# Patient Record
Sex: Female | Born: 1975 | Race: White | Hispanic: No | Marital: Married | Smoking: Never smoker
Health system: Southern US, Community
[De-identification: ages and names within clinical notes are randomized; demographics above are authoritative.]

## PROBLEM LIST (undated history)

## (undated) ENCOUNTER — Inpatient Hospital Stay (HOSPITAL_COMMUNITY): Payer: Self-pay

## (undated) DIAGNOSIS — S93609A Unspecified sprain of unspecified foot, initial encounter: Secondary | ICD-10-CM

## (undated) DIAGNOSIS — E559 Vitamin D deficiency, unspecified: Secondary | ICD-10-CM

## (undated) DIAGNOSIS — O09529 Supervision of elderly multigravida, unspecified trimester: Secondary | ICD-10-CM

## (undated) DIAGNOSIS — J45909 Unspecified asthma, uncomplicated: Secondary | ICD-10-CM

## (undated) DIAGNOSIS — M722 Plantar fascial fibromatosis: Secondary | ICD-10-CM

## (undated) DIAGNOSIS — Z309 Encounter for contraceptive management, unspecified: Secondary | ICD-10-CM

## (undated) DIAGNOSIS — E669 Obesity, unspecified: Secondary | ICD-10-CM

## (undated) DIAGNOSIS — M1990 Unspecified osteoarthritis, unspecified site: Secondary | ICD-10-CM

## (undated) DIAGNOSIS — Z3491 Encounter for supervision of normal pregnancy, unspecified, first trimester: Principal | ICD-10-CM

## (undated) DIAGNOSIS — N390 Urinary tract infection, site not specified: Secondary | ICD-10-CM

## (undated) DIAGNOSIS — Z889 Allergy status to unspecified drugs, medicaments and biological substances status: Secondary | ICD-10-CM

## (undated) DIAGNOSIS — F419 Anxiety disorder, unspecified: Secondary | ICD-10-CM

## (undated) DIAGNOSIS — K219 Gastro-esophageal reflux disease without esophagitis: Secondary | ICD-10-CM

## (undated) DIAGNOSIS — G4730 Sleep apnea, unspecified: Secondary | ICD-10-CM

## (undated) HISTORY — DX: Unspecified asthma, uncomplicated: J45.909

## (undated) HISTORY — DX: Encounter for supervision of normal pregnancy, unspecified, first trimester: Z34.91

## (undated) HISTORY — DX: Vitamin D deficiency, unspecified: E55.9

## (undated) HISTORY — DX: Sleep apnea, unspecified: G47.30

## (undated) HISTORY — DX: Encounter for contraceptive management, unspecified: Z30.9

## (undated) HISTORY — DX: Unspecified osteoarthritis, unspecified site: M19.90

## (undated) HISTORY — DX: Plantar fascial fibromatosis: M72.2

## (undated) HISTORY — DX: Urinary tract infection, site not specified: N39.0

## (undated) HISTORY — DX: Supervision of elderly multigravida, unspecified trimester: O09.529

## (undated) HISTORY — PX: WISDOM TOOTH EXTRACTION: SHX21

## (undated) HISTORY — DX: Unspecified sprain of unspecified foot, initial encounter: S93.609A

---

## 1999-09-04 ENCOUNTER — Encounter: Admission: RE | Admit: 1999-09-04 | Discharge: 1999-09-04 | Payer: Self-pay | Admitting: Family Medicine

## 1999-09-04 ENCOUNTER — Encounter: Payer: Self-pay | Admitting: Family Medicine

## 2000-09-14 ENCOUNTER — Other Ambulatory Visit: Admission: RE | Admit: 2000-09-14 | Discharge: 2000-09-14 | Payer: Self-pay | Admitting: Obstetrics and Gynecology

## 2001-09-18 ENCOUNTER — Other Ambulatory Visit: Admission: RE | Admit: 2001-09-18 | Discharge: 2001-09-18 | Payer: Self-pay | Admitting: Obstetrics and Gynecology

## 2002-09-26 ENCOUNTER — Other Ambulatory Visit: Admission: RE | Admit: 2002-09-26 | Discharge: 2002-09-26 | Payer: Self-pay | Admitting: Obstetrics and Gynecology

## 2003-04-20 HISTORY — PX: OTHER SURGICAL HISTORY: SHX169

## 2010-10-06 ENCOUNTER — Encounter: Payer: Self-pay | Admitting: Adult Health

## 2011-11-10 ENCOUNTER — Other Ambulatory Visit (HOSPITAL_COMMUNITY)
Admission: RE | Admit: 2011-11-10 | Discharge: 2011-11-10 | Disposition: A | Discharge status: Home / Self Care | Payer: BC Managed Care – PPO | Source: Ambulatory Visit | Attending: Obstetrics and Gynecology | Admitting: Obstetrics and Gynecology

## 2011-11-10 ENCOUNTER — Other Ambulatory Visit: Payer: Self-pay | Admitting: Adult Health

## 2011-11-10 DIAGNOSIS — Z113 Encounter for screening for infections with a predominantly sexual mode of transmission: Secondary | ICD-10-CM | POA: Insufficient documentation

## 2011-11-10 DIAGNOSIS — R87810 Cervical high risk human papillomavirus (HPV) DNA test positive: Secondary | ICD-10-CM | POA: Insufficient documentation

## 2011-11-10 DIAGNOSIS — Z01419 Encounter for gynecological examination (general) (routine) without abnormal findings: Secondary | ICD-10-CM | POA: Insufficient documentation

## 2011-11-10 LAB — OB RESULTS CONSOLE ANTIBODY SCREEN: Antibody Screen: NEGATIVE

## 2011-11-10 LAB — OB RESULTS CONSOLE HEPATITIS B SURFACE ANTIGEN: Hepatitis B Surface Ag: NEGATIVE

## 2012-01-09 ENCOUNTER — Encounter (HOSPITAL_COMMUNITY): Payer: Self-pay | Admitting: *Deleted

## 2012-01-09 ENCOUNTER — Inpatient Hospital Stay (HOSPITAL_COMMUNITY)
Admission: AD | Admit: 2012-01-09 | Discharge: 2012-01-09 | Disposition: A | Discharge status: Home / Self Care | Payer: BC Managed Care – PPO | Source: Ambulatory Visit | Attending: Obstetrics and Gynecology | Admitting: Obstetrics and Gynecology

## 2012-01-09 DIAGNOSIS — O26859 Spotting complicating pregnancy, unspecified trimester: Secondary | ICD-10-CM | POA: Insufficient documentation

## 2012-01-09 DIAGNOSIS — R109 Unspecified abdominal pain: Secondary | ICD-10-CM | POA: Insufficient documentation

## 2012-01-09 HISTORY — DX: Allergy status to unspecified drugs, medicaments and biological substances: Z88.9

## 2012-01-09 HISTORY — DX: Obesity, unspecified: E66.9

## 2012-01-09 LAB — URINALYSIS, ROUTINE W REFLEX MICROSCOPIC
Bilirubin Urine: NEGATIVE
Glucose, UA: NEGATIVE mg/dL
Hgb urine dipstick: NEGATIVE
Ketones, ur: NEGATIVE mg/dL
Leukocytes, UA: NEGATIVE
Nitrite: NEGATIVE
Protein, ur: NEGATIVE mg/dL
Specific Gravity, Urine: <1.005 — ABNORMAL LOW (ref 1.005–1.030)
Urobilinogen, UA: 0.2 mg/dL (ref 0.0–1.0)
pH: 5.5 (ref 5.0–8.0)

## 2012-01-09 LAB — WET PREP, GENITAL
Clue Cells Wet Prep HPF POC: NONE SEEN
Trich, Wet Prep: NONE SEEN
Yeast Wet Prep HPF POC: NONE SEEN

## 2012-01-09 NOTE — MAU Note (Signed)
"  WHen I went to the [facility] Friday, I saw brown d/c with wiping.  It has continued, but today it was a little more pink.  I've had more pelvic pressure since Friday into Saturday morning.  No LOF."

## 2012-01-09 NOTE — MAU Note (Signed)
Pt reports on Friday had some brown mucusy discharge when wiping. Had some yesterday and brighter red today and reports increased pelvic pressure.

## 2012-01-09 NOTE — MAU Provider Note (Signed)
History     CSN: [id number]  Arrival date and time: 01/09/12 1130   First Provider Initiated Contact with Patient 01/09/12 1232      Chief Complaint  Patient presents with  . Vaginal Bleeding   HPI 36 y.o. G1P0 at [redacted]w[redacted]d with brown/pink discharge since Friday, only with wiping, pelvic pressure. Prenatal care at [facility] - states she has had 3 normal u/s.    Past Medical History  Diagnosis Date  . Obesity   . Multiple allergies     Past Surgical History  Procedure Date  . Carpel tunnel release 2005    bilateral    Family History  Problem Relation Age of Onset  . Stroke Maternal Grandmother   . Heart disease Father   . Hypertension Mother     History  Substance Use Topics  . Smoking status: Never Smoker   . Smokeless tobacco: Never Used  . Alcohol Use: No    Allergies: No Known Allergies  Prescriptions prior to admission  Medication Sig Dispense Refill  . cetirizine (ZYRTEC) 10 MG tablet Take 10 mg by mouth daily.      . hydrocortisone cream 1 % Apply 1 application topically 2 (two) times daily. For rash      . Prenatal Vit-Fe Fumarate-FA (PRENATAL MULTIVITAMIN) TABS Take 1 tablet by mouth daily.      . pseudoephedrine-acetaminophen (TYLENOL SINUS) 30-500 MG TABS Take 1 tablet by mouth every 4 (four) hours as needed. For cold symptoms        Review of Systems  Constitutional: Negative.   Respiratory: Negative.   Cardiovascular: Negative.   Gastrointestinal: Negative for nausea, vomiting, abdominal pain, diarrhea and constipation.  Genitourinary: Negative for dysuria, urgency, frequency, hematuria and flank pain.       Positive for vaginal discharge   Musculoskeletal: Negative.   Neurological: Negative.   Psychiatric/Behavioral: Negative.    Physical Exam   Blood pressure 135/86, pulse 102, temperature 98.2 F (36.8 C), temperature source Oral, resp. rate 18, height 5\' 4"  (1.626 m), weight 233 lb 12.8 oz (106.051 kg).  Physical Exam    Constitutional: She is oriented to person, place, and time. She appears well-developed and well-nourished. No distress.  HENT:  Head: Normocephalic and atraumatic.  Cardiovascular: Normal rate, regular rhythm and normal heart sounds.   Respiratory: Effort normal and breath sounds normal. No respiratory distress.  GI: Soft. Bowel sounds are normal. She exhibits no distension and no mass. There is no tenderness. There is no rebound and no guarding.  Genitourinary: There is no rash or lesion on the right labia. There is no rash or lesion on the left labia. Uterus is not deviated, not enlarged, not fixed and not tender. Cervix exhibits friability. Cervix exhibits no motion tenderness and no discharge. Right adnexum displays no mass, no tenderness and no fullness. Left adnexum displays no mass, no tenderness and no fullness. No erythema, tenderness or bleeding around the vagina. Vaginal discharge (small amount of blood tinged mucous) found.  Neurological: She is alert and oriented to person, place, and time.  Skin: Skin is warm and dry.  Psychiatric: She has a normal mood and affect.   Cervix long and closed + FHR  MAU Course  Procedures Results for orders placed during the hospital encounter of 01/09/12 (from the past 24 hour(s))  URINALYSIS, ROUTINE W REFLEX MICROSCOPIC     Status: Abnormal   Collection Time   01/09/12 11:43 AM      Component Value Range  Color, Urine YELLOW  YELLOW   APPearance CLEAR  CLEAR   Specific Gravity, Urine <1.005 (*) 1.005 - 1.030   pH 5.5  5.0 - 8.0   Glucose, UA NEGATIVE  NEGATIVE mg/dL   Hgb urine dipstick NEGATIVE  NEGATIVE   Bilirubin Urine NEGATIVE  NEGATIVE   Ketones, ur NEGATIVE  NEGATIVE mg/dL   Protein, ur NEGATIVE  NEGATIVE mg/dL   Urobilinogen, UA 0.2  0.0 - 1.0 mg/dL   Nitrite NEGATIVE  NEGATIVE   Leukocytes, UA NEGATIVE  NEGATIVE  WET PREP, GENITAL     Status: Abnormal   Collection Time   01/09/12 12:32 PM      Component Value Range   Yeast  Wet Prep HPF POC NONE SEEN  NONE SEEN   Trich, Wet Prep NONE SEEN  NONE SEEN   Clue Cells Wet Prep HPF POC NONE SEEN  NONE SEEN   WBC, Wet Prep HPF POC MANY (*) NONE SEEN     Assessment and Plan  36 y.o. G1P0 at [redacted]w[redacted]d 1. Spotting in pregnancy   2. Round ligament pain  Precautions rev'd Follow up as scheduled  Lyn Joens 01/09/2012, 12:32 PM

## 2012-01-10 LAB — GC/CHLAMYDIA PROBE AMP, GENITAL
Chlamydia, DNA Probe: NEGATIVE
GC Probe Amp, Genital: NEGATIVE

## 2012-01-18 NOTE — MAU Provider Note (Signed)
Attestation of Attending Supervision of Advanced Practitioner: Evaluation and management procedures were performed by the PA/NP/CNM/OB Fellow under my supervision/collaboration. Chart reviewed and agree with management and plan.  Roanne Haye V 01/18/2012 3:37 PM     

## 2012-03-18 ENCOUNTER — Encounter (HOSPITAL_COMMUNITY): Payer: Self-pay | Admitting: Obstetrics and Gynecology

## 2012-03-18 ENCOUNTER — Inpatient Hospital Stay (HOSPITAL_COMMUNITY)
Admission: AD | Admit: 2012-03-18 | Discharge: 2012-03-18 | Disposition: A | Discharge status: Home / Self Care | Payer: BC Managed Care – PPO | Source: Ambulatory Visit | Attending: Obstetrics and Gynecology | Admitting: Obstetrics and Gynecology

## 2012-03-18 DIAGNOSIS — O26859 Spotting complicating pregnancy, unspecified trimester: Secondary | ICD-10-CM | POA: Insufficient documentation

## 2012-03-18 DIAGNOSIS — N72 Inflammatory disease of cervix uteri: Secondary | ICD-10-CM | POA: Insufficient documentation

## 2012-03-18 DIAGNOSIS — O2390 Unspecified genitourinary tract infection in pregnancy, unspecified trimester: Secondary | ICD-10-CM | POA: Insufficient documentation

## 2012-03-18 LAB — URINALYSIS, ROUTINE W REFLEX MICROSCOPIC
Nitrite: NEGATIVE
Specific Gravity, Urine: 1.01 (ref 1.005–1.030)
Urobilinogen, UA: 0.2 mg/dL (ref 0.0–1.0)

## 2012-03-18 LAB — URINE MICROSCOPIC-ADD ON

## 2012-03-18 LAB — WET PREP, GENITAL: Yeast Wet Prep HPF POC: NONE SEEN

## 2012-03-18 NOTE — MAU Note (Signed)
Pt reports having some brown spotting since Thursday Can bee a little red at times. Denies any pain or cramping

## 2012-03-18 NOTE — MAU Note (Signed)
"  I noticed some spotting on Thursday and it has been off and on.  It is brownish in color and then there will be none.  No pain at all.  (+) FM."

## 2012-03-18 NOTE — MAU Provider Note (Signed)
History     CSN: [id number]  Arrival date and time: 03/18/12 2952   First Provider Initiated Contact with Patient 03/18/12 1050      Chief Complaint  Patient presents with  . Vaginal Bleeding   HPI  Jamie Wang is a 36 y.o. G1P0 who presents today with brown-ish spotting since Thursday. The last time she had intercourse was about a week ago. She has had this same brown spotting off and on all throughout her pregnancy. Baby is moving normally. Denies any gush or leaking of fluid. Denies any pain.   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  Past Medical History  Diagnosis Date  . Obesity   . Multiple allergies     Past Surgical History  Procedure Date  . Carpel tunnel release 2005    bilateral    Family History  Problem Relation Age of Onset  . Stroke Maternal Grandmother   . Heart disease Father   . Hypertension Mother     History  Substance Use Topics  . Smoking status: Never Smoker   . Smokeless tobacco: Never Used  . Alcohol Use: No    Allergies: No Known Allergies  Prescriptions prior to admission  Medication Sig Dispense Refill  . cetirizine (ZYRTEC) 10 MG tablet Take 10 mg by mouth daily.      . hydrocortisone cream 1 % Apply 1 application topically 2 (two) times daily. For rash      . Prenatal Vit-Fe Fumarate-FA (PRENATAL MULTIVITAMIN) TABS Take 1 tablet by mouth daily.        Review of Systems  Constitutional: Negative for fever.  Gastrointestinal: Negative for nausea, vomiting, abdominal pain, diarrhea and constipation.  Genitourinary: Negative for dysuria, urgency and frequency.   Physical Exam   Blood pressure 131/92, pulse 101, temperature 98.5 F (36.9 C), temperature source Oral, resp. rate 18, height 5\' 4"  (1.626 m), weight 238 lb (107.956 kg).  Physical Exam  Nursing note and vitals reviewed. Constitutional: She is oriented to person, place, and time. She appears well-developed and  well-nourished.  Cardiovascular: Normal rate.   Respiratory: Effort normal.  GI: Soft.  Genitourinary:        External: normal Vagina: small amount of blood tinged mucous present Cervix: 1 at ext os, but closed at internal os/thick/high Small amount of blood seen after cervical exam Uterus: AGA FHT 120, moderate 15x15 accels.No decels Toco: no UCs.   Neurological: She is alert and oriented to person, place, and time.  Skin: Skin is warm and dry.    MAU Course  Procedures  Results for orders placed during the hospital encounter of 03/18/12 (from the past 24 hour(s))  URINALYSIS, ROUTINE W REFLEX MICROSCOPIC     Status: Abnormal   Collection Time   03/18/12 10:00 AM      Component Value Range   Color, Urine YELLOW  YELLOW   APPearance CLEAR  CLEAR   Specific Gravity, Urine 1.010  1.005 - 1.030   pH 7.0  5.0 - 8.0   Glucose, UA NEGATIVE  NEGATIVE mg/dL   Hgb urine dipstick TRACE (*) NEGATIVE   Bilirubin Urine NEGATIVE  NEGATIVE   Ketones, ur NEGATIVE  NEGATIVE mg/dL   Protein, ur NEGATIVE  NEGATIVE mg/dL   Urobilinogen, UA 0.2  0.0 - 1.0 mg/dL   Nitrite NEGATIVE  NEGATIVE   Leukocytes, UA SMALL (*) NEGATIVE  URINE MICROSCOPIC-ADD ON     Status: Abnormal  Collection Time   03/18/12 10:00 AM      Component Value Range   Squamous Epithelial / LPF FEW (*) RARE   WBC, UA 0-2  <3 WBC/hpf   RBC / HPF 0-2  <3 RBC/hpf  WET PREP, GENITAL     Status: Abnormal   Collection Time   03/18/12 11:04 AM      Component Value Range   Yeast Wet Prep HPF POC NONE SEEN  NONE SEEN   Trich, Wet Prep NONE SEEN  NONE SEEN   Clue Cells Wet Prep HPF POC NONE SEEN  NONE SEEN   WBC, Wet Prep HPF POC MANY (*) NONE SEEN    Assessment and Plan  Cervicitis in pregnancy FU with PCP as needed.   Tawnya Crook 03/18/2012, 10:51 AM

## 2012-03-20 NOTE — MAU Provider Note (Signed)
Attestation of Attending Supervision of Advanced Practitioner (CNM/NP): Evaluation and management procedures were performed by the Advanced Practitioner under my supervision and collaboration.  I have reviewed the Advanced Practitioner's note and chart, and I agree with the management and plan.  Jamiee Milholland 03/20/2012 12:42 PM   

## 2012-04-13 ENCOUNTER — Encounter (HOSPITAL_COMMUNITY)
Admission: AD | Disposition: A | Discharge status: Home / Self Care | Payer: Self-pay | Source: Ambulatory Visit | Attending: Obstetrics and Gynecology

## 2012-04-13 ENCOUNTER — Encounter (HOSPITAL_COMMUNITY): Payer: Self-pay | Admitting: Anesthesiology

## 2012-04-13 ENCOUNTER — Encounter (HOSPITAL_COMMUNITY): Payer: BC Managed Care – PPO

## 2012-04-13 ENCOUNTER — Inpatient Hospital Stay (HOSPITAL_COMMUNITY): Payer: BC Managed Care – PPO

## 2012-04-13 ENCOUNTER — Encounter (HOSPITAL_COMMUNITY): Payer: Self-pay | Admitting: *Deleted

## 2012-04-13 ENCOUNTER — Inpatient Hospital Stay (HOSPITAL_COMMUNITY)
Admission: AD | Admit: 2012-04-13 | Discharge: 2012-04-16 | DRG: 371 | Disposition: A | Discharge status: Home / Self Care | Payer: BC Managed Care – PPO | Source: Ambulatory Visit | Attending: Obstetrics and Gynecology | Admitting: Obstetrics and Gynecology

## 2012-04-13 ENCOUNTER — Inpatient Hospital Stay (HOSPITAL_COMMUNITY): Payer: BC Managed Care – PPO | Admitting: Anesthesiology

## 2012-04-13 DIAGNOSIS — O365990 Maternal care for other known or suspected poor fetal growth, unspecified trimester, not applicable or unspecified: Secondary | ICD-10-CM | POA: Diagnosis present

## 2012-04-13 DIAGNOSIS — O321XX0 Maternal care for breech presentation, not applicable or unspecified: Secondary | ICD-10-CM | POA: Diagnosis present

## 2012-04-13 DIAGNOSIS — O09529 Supervision of elderly multigravida, unspecified trimester: Secondary | ICD-10-CM

## 2012-04-13 DIAGNOSIS — IMO0002 Reserved for concepts with insufficient information to code with codable children: Secondary | ICD-10-CM | POA: Diagnosis present

## 2012-04-13 DIAGNOSIS — O368190 Decreased fetal movements, unspecified trimester, not applicable or unspecified: Secondary | ICD-10-CM | POA: Diagnosis present

## 2012-04-13 DIAGNOSIS — Z98891 History of uterine scar from previous surgery: Secondary | ICD-10-CM

## 2012-04-13 DIAGNOSIS — O288 Other abnormal findings on antenatal screening of mother: Secondary | ICD-10-CM

## 2012-04-13 DIAGNOSIS — O09519 Supervision of elderly primigravida, unspecified trimester: Secondary | ICD-10-CM | POA: Diagnosis present

## 2012-04-13 LAB — CBC
HCT: 38.5 % (ref 36.0–46.0)
Hemoglobin: 13.4 g/dL (ref 12.0–15.0)
Platelets: 204 10*3/uL (ref 150–400)
RBC: 4.27 MIL/uL (ref 3.87–5.11)
WBC: 8.3 10*3/uL (ref 4.0–10.5)

## 2012-04-13 LAB — TYPE AND SCREEN: ABO/RH(D): A POS

## 2012-04-13 LAB — RPR: RPR Ser Ql: NONREACTIVE

## 2012-04-13 LAB — RAPID URINE DRUG SCREEN, HOSP PERFORMED: Amphetamines: NOT DETECTED

## 2012-04-13 LAB — ABO/RH: ABO/RH(D): A POS

## 2012-04-13 SURGERY — Surgical Case
Anesthesia: Spinal | Site: Abdomen | Wound class: Clean Contaminated

## 2012-04-13 MED ORDER — METOCLOPRAMIDE HCL 5 MG/ML IJ SOLN: 10.0000 mg | Freq: Three times a day (TID) | INTRAMUSCULAR | Status: DC | PRN

## 2012-04-13 MED ORDER — LIDOCAINE-EPINEPHRINE 2 %-1:100000 IJ SOLN: INTRAMUSCULAR | Status: DC | PRN

## 2012-04-13 MED ORDER — DIPHENHYDRAMINE HCL 50 MG/ML IJ SOLN
12.5000 mg | INTRAMUSCULAR | Status: DC | PRN
Start: 2012-04-13 — End: 2012-04-16

## 2012-04-13 MED ORDER — WITCH HAZEL-GLYCERIN EX PADS: 1.0000 "application " | MEDICATED_PAD | CUTANEOUS | Status: DC | PRN

## 2012-04-13 MED ORDER — SIMETHICONE 80 MG PO CHEW
80.0000 mg | CHEWABLE_TABLET | Freq: Three times a day (TID) | ORAL | Status: DC
  Administered 2012-04-13 – 2012-04-16 (×10): 80 mg via ORAL

## 2012-04-13 MED ORDER — CEFAZOLIN SODIUM-DEXTROSE 2-3 GM-% IV SOLR
INTRAVENOUS | Status: DC | PRN
  Administered 2012-04-13: 2 g via INTRAVENOUS

## 2012-04-13 MED ORDER — HYDROMORPHONE HCL PF 1 MG/ML IJ SOLN: 0.2500 mg | INTRAMUSCULAR | Status: DC | PRN

## 2012-04-13 MED ORDER — IBUPROFEN 600 MG PO TABS
600.0000 mg | ORAL_TABLET | Freq: Four times a day (QID) | ORAL | Status: DC
  Administered 2012-04-13 – 2012-04-16 (×11): 600 mg via ORAL
  Filled 2012-04-13 (×11): qty 1

## 2012-04-13 MED ORDER — MEPERIDINE HCL 25 MG/ML IJ SOLN: 6.2500 mg | INTRAMUSCULAR | Status: DC | PRN

## 2012-04-13 MED ORDER — DIPHENHYDRAMINE HCL 25 MG PO CAPS: 25.0000 mg | ORAL_CAPSULE | Freq: Four times a day (QID) | ORAL | Status: DC | PRN

## 2012-04-13 MED ORDER — PRENATAL MULTIVITAMIN CH: 1.0000 | ORAL_TABLET | Freq: Every day | ORAL | Status: DC

## 2012-04-13 MED ORDER — PHENYLEPHRINE 40 MCG/ML (10ML) SYRINGE FOR IV PUSH (FOR BLOOD PRESSURE SUPPORT)
PREFILLED_SYRINGE | INTRAVENOUS | Status: AC
  Filled 2012-04-13: qty 5

## 2012-04-13 MED ORDER — MAGNESIUM SULFATE 40 G IN LACTATED RINGERS - SIMPLE
2.0000 g/h | INTRAVENOUS | Status: DC
  Filled 2012-04-13: qty 500

## 2012-04-13 MED ORDER — NALBUPHINE HCL 10 MG/ML IJ SOLN
5.0000 mg | INTRAMUSCULAR | Status: DC | PRN
  Filled 2012-04-13: qty 1

## 2012-04-13 MED ORDER — MAGNESIUM SULFATE BOLUS VIA INFUSION
4.0000 g | Freq: Once | INTRAVENOUS | Status: AC
  Administered 2012-04-13: 4 g via INTRAVENOUS
  Filled 2012-04-13: qty 500

## 2012-04-13 MED ORDER — DOCUSATE SODIUM 100 MG PO CAPS: 100.0000 mg | ORAL_CAPSULE | Freq: Every day | ORAL | Status: DC

## 2012-04-13 MED ORDER — PRENATAL MULTIVITAMIN CH
1.0000 | ORAL_TABLET | Freq: Every day | ORAL | Status: DC
  Administered 2012-04-14 – 2012-04-15 (×2): 1 via ORAL
  Filled 2012-04-13 (×2): qty 1

## 2012-04-13 MED ORDER — SODIUM BICARBONATE 8.4 % IV SOLN
INTRAVENOUS | Status: DC | PRN
  Administered 2012-04-13: 5 mL via EPIDURAL

## 2012-04-13 MED ORDER — PHENYLEPHRINE 40 MCG/ML (10ML) SYRINGE FOR IV PUSH (FOR BLOOD PRESSURE SUPPORT)
PREFILLED_SYRINGE | INTRAVENOUS | Status: AC
  Filled 2012-04-13: qty 15

## 2012-04-13 MED ORDER — FENTANYL CITRATE 0.05 MG/ML IJ SOLN
INTRAMUSCULAR | Status: DC | PRN
  Administered 2012-04-13: 12.5 ug via INTRATHECAL

## 2012-04-13 MED ORDER — OXYCODONE-ACETAMINOPHEN 5-325 MG PO TABS
1.0000 | ORAL_TABLET | ORAL | Status: DC | PRN
  Administered 2012-04-14: 1 via ORAL
  Filled 2012-04-13 (×2): qty 1

## 2012-04-13 MED ORDER — NALOXONE HCL 1 MG/ML IJ SOLN
1.0000 ug/kg/h | INTRAVENOUS | Status: DC | PRN
  Filled 2012-04-13: qty 2

## 2012-04-13 MED ORDER — MORPHINE SULFATE (PF) 0.5 MG/ML IJ SOLN
INTRAMUSCULAR | Status: DC | PRN
  Administered 2012-04-13: .2 mg via INTRATHECAL

## 2012-04-13 MED ORDER — BETAMETHASONE SOD PHOS & ACET 6 (3-3) MG/ML IJ SUSP
12.0000 mg | Freq: Once | INTRAMUSCULAR | Status: AC
  Administered 2012-04-13: 12 mg via INTRAMUSCULAR
  Filled 2012-04-13: qty 2

## 2012-04-13 MED ORDER — OXYTOCIN 10 UNIT/ML IJ SOLN
INTRAMUSCULAR | Status: AC
  Filled 2012-04-13: qty 4

## 2012-04-13 MED ORDER — ZOLPIDEM TARTRATE 5 MG PO TABS: 5.0000 mg | ORAL_TABLET | Freq: Every evening | ORAL | Status: DC | PRN

## 2012-04-13 MED ORDER — 0.9 % SODIUM CHLORIDE (POUR BTL) OPTIME
TOPICAL | Status: DC | PRN
  Administered 2012-04-13: 1000 mL

## 2012-04-13 MED ORDER — DIBUCAINE 1 % RE OINT: 1.0000 "application " | TOPICAL_OINTMENT | RECTAL | Status: DC | PRN

## 2012-04-13 MED ORDER — SODIUM CHLORIDE 0.9 % IJ SOLN: 3.0000 mL | INTRAMUSCULAR | Status: DC | PRN

## 2012-04-13 MED ORDER — SCOPOLAMINE 1 MG/3DAYS TD PT72
MEDICATED_PATCH | TRANSDERMAL | Status: AC
  Administered 2012-04-13: 1.5 mg via TRANSDERMAL
  Filled 2012-04-13: qty 1

## 2012-04-13 MED ORDER — BUPIVACAINE IN DEXTROSE 0.75-8.25 % IT SOLN
INTRATHECAL | Status: DC | PRN
  Administered 2012-04-13: 1.5 mL via INTRATHECAL

## 2012-04-13 MED ORDER — ACETAMINOPHEN 325 MG PO TABS: 650.0000 mg | ORAL_TABLET | ORAL | Status: DC | PRN

## 2012-04-13 MED ORDER — CALCIUM CARBONATE ANTACID 500 MG PO CHEW: 2.0000 | CHEWABLE_TABLET | ORAL | Status: DC | PRN

## 2012-04-13 MED ORDER — ONDANSETRON HCL 4 MG PO TABS: 4.0000 mg | ORAL_TABLET | ORAL | Status: DC | PRN

## 2012-04-13 MED ORDER — MORPHINE SULFATE 0.5 MG/ML IJ SOLN
INTRAMUSCULAR | Status: AC
  Filled 2012-04-13: qty 10

## 2012-04-13 MED ORDER — SIMETHICONE 80 MG PO CHEW: 80.0000 mg | CHEWABLE_TABLET | ORAL | Status: DC | PRN

## 2012-04-13 MED ORDER — LANOLIN HYDROUS EX OINT: 1.0000 "application " | TOPICAL_OINTMENT | CUTANEOUS | Status: DC | PRN

## 2012-04-13 MED ORDER — EPHEDRINE 5 MG/ML INJ
INTRAVENOUS | Status: AC
  Filled 2012-04-13: qty 10

## 2012-04-13 MED ORDER — PHENYLEPHRINE HCL 10 MG/ML IJ SOLN
INTRAMUSCULAR | Status: DC | PRN
  Administered 2012-04-13 (×3): 80 ug via INTRAVENOUS
  Administered 2012-04-13: 40 ug via INTRAVENOUS
  Administered 2012-04-13 (×3): 80 ug via INTRAVENOUS
  Administered 2012-04-13: 40 ug via INTRAVENOUS
  Administered 2012-04-13: 80 ug via INTRAVENOUS
  Administered 2012-04-13: 40 ug via INTRAVENOUS
  Administered 2012-04-13: 80 ug via INTRAVENOUS
  Administered 2012-04-13: 40 ug via INTRAVENOUS

## 2012-04-13 MED ORDER — CEFAZOLIN SODIUM-DEXTROSE 2-3 GM-% IV SOLR
INTRAVENOUS | Status: AC
  Filled 2012-04-13: qty 50

## 2012-04-13 MED ORDER — LACTATED RINGERS IV SOLN
INTRAVENOUS | Status: DC
  Administered 2012-04-13 (×2): via INTRAVENOUS

## 2012-04-13 MED ORDER — LACTATED RINGERS IV SOLN
INTRAVENOUS | Status: DC | PRN
  Administered 2012-04-13 (×2): via INTRAVENOUS

## 2012-04-13 MED ORDER — KETOROLAC TROMETHAMINE 30 MG/ML IJ SOLN: 30.0000 mg | Freq: Four times a day (QID) | INTRAMUSCULAR | Status: AC | PRN

## 2012-04-13 MED ORDER — ONDANSETRON HCL 4 MG/2ML IJ SOLN
INTRAMUSCULAR | Status: AC
  Filled 2012-04-13: qty 2

## 2012-04-13 MED ORDER — KETOROLAC TROMETHAMINE 30 MG/ML IJ SOLN: 15.0000 mg | Freq: Once | INTRAMUSCULAR | Status: DC | PRN

## 2012-04-13 MED ORDER — KETOROLAC TROMETHAMINE 60 MG/2ML IM SOLN
INTRAMUSCULAR | Status: AC
  Administered 2012-04-13: 60 mg via INTRAMUSCULAR
  Filled 2012-04-13: qty 2

## 2012-04-13 MED ORDER — PROMETHAZINE HCL 25 MG/ML IJ SOLN: 6.2500 mg | INTRAMUSCULAR | Status: DC | PRN

## 2012-04-13 MED ORDER — SENNOSIDES-DOCUSATE SODIUM 8.6-50 MG PO TABS
2.0000 | ORAL_TABLET | Freq: Every day | ORAL | Status: DC
  Administered 2012-04-13 – 2012-04-15 (×3): 2 via ORAL

## 2012-04-13 MED ORDER — SCOPOLAMINE 1 MG/3DAYS TD PT72
1.0000 | MEDICATED_PATCH | Freq: Once | TRANSDERMAL | Status: AC
  Administered 2012-04-13: 1.5 mg via TRANSDERMAL

## 2012-04-13 MED ORDER — TETANUS-DIPHTH-ACELL PERTUSSIS 5-2.5-18.5 LF-MCG/0.5 IM SUSP
0.5000 mL | Freq: Once | INTRAMUSCULAR | Status: AC
  Administered 2012-04-14: 0.5 mL via INTRAMUSCULAR
  Filled 2012-04-13: qty 0.5

## 2012-04-13 MED ORDER — OXYTOCIN 10 UNIT/ML IJ SOLN
40.0000 [IU] | INTRAVENOUS | Status: DC | PRN
  Administered 2012-04-13: 40 [IU] via INTRAVENOUS

## 2012-04-13 MED ORDER — MENTHOL 3 MG MT LOZG: 1.0000 | LOZENGE | OROMUCOSAL | Status: DC | PRN

## 2012-04-13 MED ORDER — EPHEDRINE SULFATE 50 MG/ML IJ SOLN
INTRAMUSCULAR | Status: DC | PRN
  Administered 2012-04-13 (×2): 10 mg via INTRAVENOUS

## 2012-04-13 MED ORDER — DIPHENHYDRAMINE HCL 25 MG PO CAPS: 25.0000 mg | ORAL_CAPSULE | ORAL | Status: DC | PRN

## 2012-04-13 MED ORDER — CITRIC ACID-SODIUM CITRATE 334-500 MG/5ML PO SOLN
ORAL | Status: AC
  Filled 2012-04-13: qty 15

## 2012-04-13 MED ORDER — NALOXONE HCL 0.4 MG/ML IJ SOLN: 0.4000 mg | INTRAMUSCULAR | Status: DC | PRN

## 2012-04-13 MED ORDER — DIPHENHYDRAMINE HCL 50 MG/ML IJ SOLN: 25.0000 mg | INTRAMUSCULAR | Status: DC | PRN

## 2012-04-13 MED ORDER — LACTATED RINGERS IV SOLN: INTRAVENOUS | Status: DC

## 2012-04-13 MED ORDER — ONDANSETRON HCL 4 MG/2ML IJ SOLN: 4.0000 mg | Freq: Three times a day (TID) | INTRAMUSCULAR | Status: DC | PRN

## 2012-04-13 MED ORDER — ONDANSETRON HCL 4 MG/2ML IJ SOLN
INTRAMUSCULAR | Status: DC | PRN
  Administered 2012-04-13: 4 mg via INTRAVENOUS

## 2012-04-13 MED ORDER — KETOROLAC TROMETHAMINE 60 MG/2ML IM SOLN
60.0000 mg | Freq: Once | INTRAMUSCULAR | Status: AC | PRN
  Administered 2012-04-13: 60 mg via INTRAMUSCULAR

## 2012-04-13 MED ORDER — OXYTOCIN 40 UNITS IN LACTATED RINGERS INFUSION - SIMPLE MED: 62.5000 mL/h | INTRAVENOUS | Status: AC

## 2012-04-13 MED ORDER — FENTANYL CITRATE 0.05 MG/ML IJ SOLN
INTRAMUSCULAR | Status: AC
  Filled 2012-04-13: qty 2

## 2012-04-13 MED ORDER — BETAMETHASONE SOD PHOS & ACET 6 (3-3) MG/ML IJ SUSP
12.0000 mg | Freq: Once | INTRAMUSCULAR | Status: DC
  Filled 2012-04-13: qty 2

## 2012-04-13 MED ORDER — ONDANSETRON HCL 4 MG/2ML IJ SOLN: 4.0000 mg | INTRAMUSCULAR | Status: DC | PRN

## 2012-04-13 SURGICAL SUPPLY — 37 items
BENZOIN TINCTURE PRP APPL 2/3 (GAUZE/BANDAGES/DRESSINGS) IMPLANT
CLOTH BEACON ORANGE TIMEOUT ST (SAFETY) ×2 IMPLANT
DRAIN JACKSON PRT FLT 10 (DRAIN) ×2 IMPLANT
DRAPE LG THREE QUARTER DISP (DRAPES) ×2 IMPLANT
DRSG OPSITE POSTOP 4X10 (GAUZE/BANDAGES/DRESSINGS) IMPLANT
DRSG OPSITE POSTOP 4X12 (GAUZE/BANDAGES/DRESSINGS) ×2 IMPLANT
DURAPREP 26ML APPLICATOR (WOUND CARE) ×2 IMPLANT
ELECT REM PT RETURN 9FT ADLT (ELECTROSURGICAL) ×2
ELECTRODE REM PT RTRN 9FT ADLT (ELECTROSURGICAL) ×1 IMPLANT
EVACUATOR SILICONE 100CC (DRAIN) ×2 IMPLANT
EXTRACTOR VACUUM KIWI (MISCELLANEOUS) IMPLANT
GLOVE BIO SURGEON ST LM GN SZ9 (GLOVE) ×2 IMPLANT
GLOVE BIOGEL PI IND STRL 9 (GLOVE) ×2 IMPLANT
GLOVE BIOGEL PI INDICATOR 9 (GLOVE) ×2
GOWN PREVENTION PLUS LG XLONG (DISPOSABLE) ×2 IMPLANT
GOWN PREVENTION PLUS XLARGE (GOWN DISPOSABLE) ×2 IMPLANT
GOWN STRL REIN 3XL LVL4 (GOWN DISPOSABLE) ×2 IMPLANT
NEEDLE HYPO 25X5/8 SAFETYGLIDE (NEEDLE) IMPLANT
NS IRRIG 1000ML POUR BTL (IV SOLUTION) ×2 IMPLANT
PACK C SECTION WH (CUSTOM PROCEDURE TRAY) ×2 IMPLANT
PAD OB MATERNITY 4.3X12.25 (PERSONAL CARE ITEMS) IMPLANT
RETRACTOR WND ALEXIS 25 LRG (MISCELLANEOUS) ×1 IMPLANT
RTRCTR C-SECT PINK 25CM LRG (MISCELLANEOUS) IMPLANT
RTRCTR WOUND ALEXIS 25CM LRG (MISCELLANEOUS) ×2
SLEEVE SCD COMPRESS KNEE MED (MISCELLANEOUS) IMPLANT
STRIP CLOSURE SKIN 1/2X4 (GAUZE/BANDAGES/DRESSINGS) IMPLANT
SUT CHROMIC 0 CTX 36 (SUTURE) ×4 IMPLANT
SUT ETHILON 3 0 PS 1 (SUTURE) ×2 IMPLANT
SUT VIC AB 0 CT1 27 (SUTURE) ×1
SUT VIC AB 0 CT1 27XBRD ANBCTR (SUTURE) ×1 IMPLANT
SUT VIC AB 2-0 CT1 27 (SUTURE) ×3
SUT VIC AB 2-0 CT1 TAPERPNT 27 (SUTURE) ×3 IMPLANT
SUT VIC AB 4-0 KS 27 (SUTURE) ×2 IMPLANT
SYR BULB IRRIGATION 50ML (SYRINGE) IMPLANT
TOWEL OR 17X24 6PK STRL BLUE (TOWEL DISPOSABLE) ×2 IMPLANT
TRAY FOLEY CATH 14FR (SET/KITS/TRAYS/PACK) IMPLANT
WATER STERILE IRR 1000ML POUR (IV SOLUTION) ×2 IMPLANT

## 2012-04-13 NOTE — Brief Op Note (Addendum)
04/13/2012  10:30 AM  PATIENT:  Jamie Wang  36 y.o. female  PRE-OPERATIVE DIAGNOSIS:  nonreassuring fetal heart rate pregnancy 30+6 weeks, Breech presentation  POST-OPERATIVE DIAGNOSIS:  nonreassurring fetal heart rate, Fetal Growth restriction, 30+weeks, Breech presentation  PROCEDURE:  Procedure(s) (LRB) with comments: CESAREAN SECTION (N/A)Primary Low transverse incision  SURGEON:  Surgeon(s) and Role:    * Tilda Burrow, MD - Primary  PHYSICIAN ASSISTANT:   ASSISTANTS: none   ANESTHESIA:   epidural and spinal  EBL:  Total I/O In: 2250 [I.V.:2250] Out: 1125 [Urine:725; Blood:400]  BLOOD ADMINISTERED:none  DRAINS: (1) Jackson-Pratt drain(s) with closed bulb suction in the subfascial space foley to bladder drain. Foley catheter  LOCAL MEDICATIONS USED:  NONE  SPECIMEN:  Source of Specimen:  placenta to path  DISPOSITION OF SPECIMEN:  PATHOLOGY  COUNTS:  YES  TOURNIQUET:  * No tourniquets in log *  DICTATION: .Dragon Dictation Patient was taken to the operating room prepped and draped for lower surgery after spinal anesthesia was introduced. Abdominal prep was performed fetal heart rate was confirmed as 140s prior to the prepping and draping. The spinal did not give adequate analgesia so the patient was once again set up on, epidural catheter placed, and abdomen prepped and draped again. After waiting until adequate analgesia was achieved, and 940 a transverse incision was initiated and the method of Pfannenstiel. Generous bleeding vessels were encountered throughout the fatty tissue at the level of the fascia. A transverse opening of the fascia was sharply performed and the and loosened off of the underlying rectus muscles. Several large bleeders were encountered in this space. Some were cauterized and some were HEENT hemostats initially. Peritoneum was opened in midline, Alexis wound retractor positioned, and the uterus inspected. The uterus was soft boggy and the  anterior lower uterine segment was able to be opened transversely and the fetal buttocks delivered of a small 1040 g female infant Apgars    ,       . Cord pH was obtained and returned 6.98. Infant was taken to the nursery, accompanied by the baby's father. The placenta was delivered easily, was very small with a marginal insertion of the cord. Transverse incision was then closed in a running locking first layer in continuous running second layer. Good hemostasis was achieved. The the anterior peritoneum was closed with running 2-0 chromic, the fascia closed with running 0 Vicryl. There were several collateral blood vessels on the rectus muscles which required ligation. One on the right side required a figure-of-eight suture to achieve hemostasis a JP drain was placed in the sub fascial space and allowed to exit through the suprapubic area subcutaneous fatty tissues were irrigated and inspected as hemostatic, reapproximated with interrupted 3-0 Vicryl,, then 4-0 Vicryl used to close the skin with a subcuticular closure. EBL 600 cc sponge and needle counts correct  PLAN OF CARE: continue admission  PATIENT DISPOSITION:  PACU - hemodynamically stable.   Delay start of Pharmacological VTE agent (>24hrs) due to surgical blood loss or risk of bleeding: not applicable

## 2012-04-13 NOTE — Anesthesia Preprocedure Evaluation (Signed)
Anesthesia Evaluation  Patient identified by MRN, date of birth, ID band Patient awake    Reviewed: Allergy & Precautions, H&P , NPO status , Patient's Chart, lab work & pertinent test results  Airway Mallampati: II TM Distance: >3 FB Neck ROM: full    Dental No notable dental hx.    Pulmonary neg pulmonary ROS,    Pulmonary exam normal       Cardiovascular negative cardio ROS      Neuro/Psych negative neurological ROS  negative psych ROS   GI/Hepatic negative GI ROS, Neg liver ROS,   Endo/Other  Morbid obesity  Renal/GU negative Renal ROS     Musculoskeletal negative musculoskeletal ROS (+)   Abdominal (+) + obese,   Peds negative pediatric ROS (+)  Hematology negative hematology ROS (+)   Anesthesia Other Findings   Reproductive/Obstetrics (+) Pregnancy                           Anesthesia Physical Anesthesia Plan  ASA: III and emergent  Anesthesia Plan: Spinal   Post-op Pain Management:    Induction:   Airway Management Planned:   Additional Equipment:   Intra-op Plan:   Post-operative Plan:   Informed Consent: I have reviewed the patients History and Physical, chart, labs and discussed the procedure including the risks, benefits and alternatives for the proposed anesthesia with the patient or authorized representative who has indicated his/her understanding and acceptance.     Plan Discussed with: CRNA and Surgeon  Anesthesia Plan Comments:         Anesthesia Quick Evaluation

## 2012-04-13 NOTE — H&P (Signed)
   History     CSN: [id number]  Arrival date and time: 04/13/12 0127   First Provider Initiated Contact with Patient 04/13/12 0146      Chief Complaint  Patient presents with  . Decreased Fetal Movement   HPI  Pt is a G1P0 here at 30.6 wks IUP with report of less movement from baby.  Last time felt move was yesterday, felt flutter.  +FHR with doppler used at home.  No report of bleeding or leaking of fluid.  Denies contractions.  No problems during pregnancy as reported by patient.  Currently goes to [facility] for prenatal care.    Past Medical History  Diagnosis Date  . Obesity   . Multiple allergies     Past Surgical History  Procedure Date  . Carpel tunnel release 2005    bilateral    Family History  Problem Relation Age of Onset  . Stroke Maternal Grandmother   . Heart disease Father   . Hypertension Mother     History  Substance Use Topics  . Smoking status: Never Smoker   . Smokeless tobacco: Never Used  . Alcohol Use: No    Allergies: No Known Allergies  Prescriptions prior to admission  Medication Sig Dispense Refill  . cetirizine (ZYRTEC) 10 MG tablet Take 10 mg by mouth daily.      . hydrocortisone cream 1 % Apply 1 application topically 2 (two) times daily. For rash      . Prenatal Vit-Fe Fumarate-FA (PRENATAL MULTIVITAMIN) TABS Take 1 tablet by mouth daily.        Review of Systems  Constitutional:       Decreased fetal movement  All other systems reviewed and are negative.   Physical Exam   Blood pressure 148/95, pulse 80, temperature 98 F (36.7 C), temperature source Oral, resp. rate 20, height 5\' 4"  (1.626 m), weight 110.678 kg (244 lb).  Physical Exam  Constitutional: She is oriented to person, place, and time. She appears well-developed and well-nourished. No distress.  HENT:  Head: Normocephalic.  Neck: Normal range of motion. Neck supple.  Cardiovascular: Normal rate, regular rhythm and normal heart sounds.   Respiratory:  Effort normal and breath sounds normal.  GI: Soft. There is no tenderness.  Neurological: She is alert and oriented to person, place, and time.  Skin: Skin is warm and dry.    MAU Course  Procedures  NST - 130's, decel 100's x 1 min with return to baseline BPP 2/8  Assessment and Plan  Preterm Pregnancy Non-Reassuring Fetal Well-Being  Plan: Discussed with Dr. Emelda Fear > admit for observation BMZ 12 mg IM Consult with MFM Close observation Ultrasound and dopplers with MFM in am  [facility] 04/13/2012, 1:47 AM

## 2012-04-13 NOTE — Progress Notes (Signed)
MFM note  36 yo G1P0 at 3 6/7 weeks - admitted early this AM with complaints of decreased fetal movement.  Fetal strip remarkable for absent variability and some spontaneous decelerations.  BPP on admission was 2/10.  She completed a single dose of betamethasone and Magnesium sulfate was given for neuro prophylaxis.  Based on non-reassuring fetal tracing and BPP of 2/10, would recommend expeditious delivery.  Dr. Emelda Fear notified of my recommendations at approximately 0815 hrs.  Alpha Gula, MD Maternal Fetal Medicine

## 2012-04-13 NOTE — MAU Note (Signed)
DR Emelda Fear IN ROOM

## 2012-04-13 NOTE — Transfer of Care (Signed)
Immediate Anesthesia Transfer of Care Note  Patient: Jamie Wang  Procedure(s) Performed: Procedure(s) (LRB) with comments: CESAREAN SECTION (N/A)  Patient Location: PACU  Anesthesia Type:Epidural  Level of Consciousness: awake, alert  and oriented  Airway & Oxygen Therapy: Patient Spontanous Breathing  Post-op Assessment: Report given to PACU RN and Post -op Vital signs reviewed and stable  Post vital signs: Reviewed and stable  Complications: No apparent anesthesia complications

## 2012-04-13 NOTE — Op Note (Signed)
see operative details included in brief operative note

## 2012-04-13 NOTE — Progress Notes (Signed)
Dr. Emelda Fear called notified of FHR tracing with late decelerations and uc pattern with uc's every 3-8 min, orders to begin Magnesium Sulfate and monitor closely

## 2012-04-13 NOTE — MAU Note (Signed)
PT SAYS SHE HAS FELT LESS MOVEMENT FROM BABY.  LAST TIME FELT MOVE WAS  Tuesday- FELT FLUTTER-- PT HAS A DOPPLER AT HOME - SO SHE LISTENS TO FHR. .  SHE DRANK MT DEW AND ATE CHOCCOLATE LAST NIGHT- NO MOVEMENT.

## 2012-04-13 NOTE — Anesthesia Postprocedure Evaluation (Signed)
  Anesthesia Post-op Note  Patient: Jamie Wang  Procedure(s) Performed: Procedure(s) (LRB) with comments: CESAREAN SECTION (N/A)  Patient Location: Mother/Baby  Anesthesia Type:Epidural  Level of Consciousness: awake, alert  and oriented  Airway and Oxygen Therapy: Patient Spontanous Breathing  Post-op Pain: none  Post-op Assessment: Post-op Vital signs reviewed, Patient's Cardiovascular Status Stable, No headache, No backache, No residual numbness and No residual motor weakness  Post-op Vital Signs: Reviewed and stable  Complications: No apparent anesthesia complications

## 2012-04-13 NOTE — Anesthesia Postprocedure Evaluation (Signed)
Anesthesia Post Note  Patient: Jamie Wang  Procedure(s) Performed: Procedure(s) (LRB): CESAREAN SECTION (N/A)  Anesthesia type: epidural   Patient location: PACU  Post pain: Pain level controlled  Post assessment: Post-op Vital signs reviewed  Last Vitals:  Filed Vitals:   04/13/12 1145  BP: 129/65  Pulse: 125  Temp:   Resp: 20    Post vital signs: Reviewed  Level of consciousness: awake  Complications: No apparent anesthesia complications

## 2012-04-13 NOTE — H&P (Signed)
36 yr G1 P0 at [redacted]w[redacted]d admitted with decreased fetal movement, and found to have absent beat=to-beat, and occasional decel.  BPP 2/8. With normal fluid volume, no  Placental abnormalities, And BREECH presentation.  Good dating with early u/s and no medical conditions to date.  Had one episode of light spotting at 28 weeks., none recently. Now admitted to L&D where monitor shows irregular mild contractions , given Betamethasone 12 mg IM, and placed on Magnesium Sulfate 4g/2gm per hour, and given fluid bolus and O2.  Currently no decels, but beat to beat remains minimal. Will keep on continuous monitoring, and obtain Doppler studies of fetal cord this morning, Cesarean will be done for deterioration of condition.  Type and screen ordered. Likelihood of delivery soon , by cesarean, emphasized to the patient. Patient currently NPO.

## 2012-04-13 NOTE — MAU Provider Note (Signed)
   History     CSN: [id number]  Arrival date and time: 04/13/12 0127   First Provider Initiated Contact with Patient 04/13/12 0146      Chief Complaint  Patient presents with  . Decreased Fetal Movement   HPI  Pt is a G1P0 here at 30.6 wks IUP with report of less movement from baby.  Last time felt move was yesterday, felt flutter.  +FHR with doppler used at home.  No report of bleeding or leaking of fluid.  Denies contractions.  No problems during pregnancy as reported by patient.  Currently goes to [facility] for prenatal care.    Past Medical History  Diagnosis Date  . Obesity   . Multiple allergies     Past Surgical History  Procedure Date  . Carpel tunnel release 2005    bilateral    Family History  Problem Relation Age of Onset  . Stroke Maternal Grandmother   . Heart disease Father   . Hypertension Mother     History  Substance Use Topics  . Smoking status: Never Smoker   . Smokeless tobacco: Never Used  . Alcohol Use: No    Allergies: No Known Allergies  Prescriptions prior to admission  Medication Sig Dispense Refill  . cetirizine (ZYRTEC) 10 MG tablet Take 10 mg by mouth daily.      . hydrocortisone cream 1 % Apply 1 application topically 2 (two) times daily. For rash      . Prenatal Vit-Fe Fumarate-FA (PRENATAL MULTIVITAMIN) TABS Take 1 tablet by mouth daily.        Review of Systems  Constitutional:       Decreased fetal movement  All other systems reviewed and are negative.   Physical Exam   Blood pressure 148/95, pulse 80, temperature 98 F (36.7 C), temperature source Oral, resp. rate 20, height 5' 4" (1.626 m), weight 110.678 kg (244 lb).  Physical Exam  Constitutional: She is oriented to person, place, and time. She appears well-developed and well-nourished. No distress.  HENT:  Head: Normocephalic.  Neck: Normal range of motion. Neck supple.  Cardiovascular: Normal rate, regular rhythm and normal heart sounds.   Respiratory:  Effort normal and breath sounds normal.  GI: Soft. There is no tenderness.  Neurological: She is alert and oriented to person, place, and time.  Skin: Skin is warm and dry.    MAU Course  Procedures  NST - 130's, decel 100's x 1 min with return to baseline BPP 2/8  Assessment and Plan  Preterm Pregnancy Non-Reassuring Fetal Well-Being  Plan: Discussed with Dr. Ferguson > admit for observation BMZ 12 mg IM Consult with MFM Close observation Ultrasound and dopplers with MFM in am  MUHAMMAD,WALIDAH 04/13/2012, 1:47 AM   

## 2012-04-13 NOTE — Progress Notes (Signed)
Jamie Wang was tearful and still in shock over the events of the past day and the birth of her baby at 62 weeks.  She has good support and a strong faith that will help her to cope with the situation.  She requested prayer and I offered emotional support and compassionate listening as well.  We will continue to follow up with her, but please also page as needs arise.  [address] [place] Pager, [phone] 12:09 PM   04/13/12 1200  Clinical Encounter Type  Visited With Patient  Visit Type Spiritual support  Referral From Patient's clergy  Spiritual Encounters  Spiritual Needs Emotional;Prayer

## 2012-04-13 NOTE — Addendum Note (Signed)
Addendum  created 04/13/12 1406 by Sandrea Hughs., MD   Modules edited:Anesthesia LDA

## 2012-04-13 NOTE — Anesthesia Procedure Notes (Addendum)
Spinal  Patient location during procedure: OR Start time: 04/13/2012 8:56 AM End time: 04/13/2012 9:00 AM Staffing Anesthesiologist: Sandrea Hughs Performed by: anesthesiologist  Preanesthetic Checklist Completed: patient identified, site marked, surgical consent, pre-op evaluation, timeout performed, IV checked, risks and benefits discussed and monitors and equipment checked Spinal Block Patient position: sitting Prep: DuraPrep Patient monitoring: heart rate, cardiac monitor, continuous pulse ox and blood pressure Approach: midline Location: L3-4 Injection technique: single-shot Needle Needle type: Sprotte  Needle gauge: 24 G Needle length: 9 cm Needle insertion depth: 7 cm Assessment Sensory level: T12  Epidural Patient location during procedure: OB  Preanesthetic Checklist Completed: patient identified, site marked, surgical consent, pre-op evaluation, timeout performed, IV checked, risks and benefits discussed and monitors and equipment checked  Epidural Patient position: sitting Prep: site prepped and draped and DuraPrep Patient monitoring: continuous pulse ox and blood pressure Approach: midline Injection technique: LOR air  Needle:  Needle type: Tuohy  Needle gauge: 17 G Needle length: 9 cm and 9 Needle insertion depth: 6 cm Catheter type: closed end flexible Catheter size: 19 Gauge Catheter at skin depth: 12 cm Test dose: negative  Assessment Events: blood not aspirated, injection not painful, no injection resistance, negative IV test and no paresthesia  Additional Notes (-) asp heme

## 2012-04-13 NOTE — Addendum Note (Signed)
Addendum  created 04/13/12 1837 by Shanon Payor, CRNA   Modules edited:Notes Section

## 2012-04-13 NOTE — Addendum Note (Signed)
Addendum  created 04/13/12 1406 by John Riely Baskett Jr., MD   Modules edited:Anesthesia LDA    

## 2012-04-14 ENCOUNTER — Encounter (HOSPITAL_COMMUNITY): Payer: Self-pay | Admitting: Obstetrics and Gynecology

## 2012-04-14 LAB — CBC
Hemoglobin: 10.1 g/dL — ABNORMAL LOW (ref 12.0–15.0)
MCH: 31.7 pg (ref 26.0–34.0)
MCV: 92.2 fL (ref 78.0–100.0)
RBC: 3.19 MIL/uL — ABNORMAL LOW (ref 3.87–5.11)

## 2012-04-14 NOTE — Progress Notes (Signed)
This was a follow-up visit.  Aleayah reported that her daughter is doing well and so she is hopeful that all will go well, but she is still preparing herself for a different outcome.  She was appropriately tearful and is adjusting to this unexpected turn of events with her baby.  She was particularly tearful as we spoke about her upcoming discharge.  I was able to offer support and reflective listening to [place] as well as Jimmy, FOB, and Ghalia's parents.  We will continue to check in with them when we see them in the NICU, but please also page as needs arise.  [address] [place] Pager, [phone] 3:38 PM   04/14/12 1500  Clinical Encounter Type  Visited With Patient and family together  Visit Type Spiritual support;Follow-up  Spiritual Encounters  Spiritual Needs Emotional  Stress Factors  Patient Stress Factors (Premature baby in NICU)

## 2012-04-14 NOTE — Progress Notes (Signed)
Subjective: Postpartum Day 1: Cesarean Delivery Patient reports incisional pain, tolerating PO and + flatus.   Only using ibuprofen. States does not hurt much  Objective: Vital signs in last 24 hours: Temp:  [98.1 F (36.7 C)-98.9 F (37.2 C)] 98.2 F (36.8 C) (12/27 1610) Pulse Rate:  [80-125] 80  (12/27 0632) Resp:  [15-20] 18  (12/27 0632) BP: (109-151)/(54-82) 117/70 mmHg (12/27 0632) SpO2:  [91 %-100 %] 97 % (12/27 9604) Weight:  [244 lb (110.678 kg)] 244 lb (110.678 kg) (12/26 1340)  Physical Exam:  General: alert and no distress Lochia: appropriate Uterine Fundus: firm Incision: healing well, no significant drainage                                    JP drain with small amount serosanguinous fluid DVT Evaluation: No evidence of DVT seen on physical exam.   Basename 04/14/12 0535 04/13/12 0312  HGB 10.1* 13.4  HCT 29.4* 38.5    Assessment/Plan: Status post Cesarean section. Doing well postoperatively.  Continue current care Will offer discharge tomorrow.  [facility] 04/14/2012, 7:51 AM

## 2012-04-14 NOTE — Clinical Social Work Maternal (Signed)
    Clinical Social Work Department PSYCHOSOCIAL ASSESSMENT - MATERNAL/CHILD 04/14/2012  Patient:  Jamie Wang, Jamie Wang  Account Number:  [id number]  Admit Date:  04/13/2012  Marjo Bicker Name:   Jamie Wang    Clinical Social Worker:  Nobie Putnam, LCSW   Date/Time:  04/14/2012 03:54 PM  Date Referred:  04/14/2012   Referral source  NICU     Referred reason  NICU   Other referral source:    I:  FAMILY / HOME ENVIRONMENT Child's legal guardian:  PARENT  Guardian - Name Guardian - Age Guardian - Address  Jamie Wang [address] [place]; [place], [place] [address]  Jamie Wang 36 (same as above)   Other household support members/support persons Other support:    II  PSYCHOSOCIAL DATA Information Source:    Event organiser Employment:   Financial resources:  Self Pay If Medicaid - County:    School / Grade:   Maternity Care Coordinator / Child Services Coordination / Early Interventions:  Cultural issues impacting care:    III  STRENGTHS Strengths  Adequate Resources  Home prepared for Child (including basic supplies)  Supportive family/friends   Strength comment:    IV  RISK FACTORS AND CURRENT PROBLEMS Current Problem:  None   Risk Factor & Current Problem Patient Issue Family Issue Risk Factor / Current Problem Comment   N N     V  SOCIAL WORK ASSESSMENT CSW met with pt briefly to offer emotional support and resources, as needed.  Chaplain's documentation noted.  Pt appears to be feeling better today however expressed some sadness about leaving the infant when discharged. While pt is sad about the premature delivery, she is thankful that the infant will receive the care & help she needs to grow & develop properly.  She has good family support.  Pt & spouse have not purchased all the necessary supplies but have the resources to obtain them.  Pt is pleased with the information she is receiving from the NICU medical team.  She has not  selected a pediatrician at this time.  CSW provided her with a list of pediatricians in the area.  CSW informed pt that the she infant would qualify for SSI benefits, due to low birth weight and offered to complete an application.  Pt would like to discuss issue with her spouse and contact CSW at later time with decision.  The parents have reliable transportation and aware of visitation hours.  Pt appears to be appropriate and in good spirits during assessment.  CSW will continue to follow family and assist as needed until discharged.      VI SOCIAL WORK PLAN Social Work Plan  Psychosocial Support/Ongoing Assessment of Needs   Type of pt/family education:   If child protective services report - county:   If child protective services report - date:   Information/referral to community resources comment:   Other social work plan:

## 2012-04-15 ENCOUNTER — Encounter (HOSPITAL_COMMUNITY): Payer: BC Managed Care – PPO

## 2012-04-15 NOTE — Progress Notes (Addendum)
Post Partum Day #2 Subjective: no complaints, up ad lib and tolerating PO; pumping breasts; desires OCPs for contraception; reports Dr Emelda Fear mentioned possibly leaving JP in place at d/c  Objective: Blood pressure 117/77, pulse 74, temperature 98 F (36.7 C), temperature source Oral, resp. rate 18, height 5\' 4"  (1.626 m), weight 110.678 kg (244 lb), SpO2 97.00%, unknown if currently breastfeeding.  Physical Exam:  General: alert, cooperative and no distress Lochia: appropriate Uterine Fundus: firm Incision: healing well, no dehiscence, no significant erythema; dsg intact; JP drain with scant serosang fluid DVT Evaluation: No evidence of DVT seen on physical exam.   Basename 04/14/12 0535 04/13/12 0312  HGB 10.1* 13.4  HCT 29.4* 38.5    Assessment/Plan: Plan for discharge tomorrow   LOS: 2 days   Jamie Wang 04/15/2012, 7:14 AM

## 2012-04-16 DIAGNOSIS — Z98891 History of uterine scar from previous surgery: Secondary | ICD-10-CM

## 2012-04-16 MED ORDER — OXYCODONE-ACETAMINOPHEN 5-325 MG PO TABS: 1.0000 | ORAL_TABLET | ORAL | Status: DC | PRN

## 2012-04-16 MED ORDER — IBUPROFEN 600 MG PO TABS: 600.0000 mg | ORAL_TABLET | Freq: Four times a day (QID) | ORAL | Status: DC

## 2012-04-16 NOTE — Discharge Summary (Signed)
Obstetric Discharge Summary Reason for Admission: onset of labor Prenatal Procedures: ultrasound Intrapartum Procedures: cesarean: low cervical, transverse with placement of JP drain Postpartum Procedures: none Complications-Operative and Postpartum: none Hemoglobin  Date Value Range Status  04/14/2012 10.1* 12.0 - 15.0 g/dL Final     DELTA CHECK NOTED     REPEATED TO VERIFY     HCT  Date Value Range Status  04/14/2012 29.4* 36.0 - 46.0 % Final    Physical Exam:  General: alert, cooperative and no distress Lochia: appropriate, scant Uterine Fundus: firm, -1 Incision: no significant drainage DVT Evaluation: No evidence of DVT seen on physical exam. Negative Homan's sign. No cords or calf tenderness. No significant calf/ankle edema.  Discharge Diagnoses: Preterm labor delivered  Discharge Information: Date: 04/16/2012 Activity: pelvic rest Diet: routine Medications: Ibuprofen and Percocet Condition: stable Instructions: refer to practice specific booklet Discharge to: home Dr Emelda Fear to remove JP drain in office  Follow-up Information    Follow up with [facility]. (Keep scheduled appointment on Thursday.  )    Contact information:   [address] [place] [address] [phone]         Newborn Data: Live born female  Birth Weight: 2 lb 6.5 oz (1090 g) APGAR: 4, 8  Infant in NICU  LEFTWICH-KIRBY, Yulianna Folse 04/16/2012, 8:51 AM

## 2012-04-19 NOTE — MAU Provider Note (Signed)
Attestation of Attending Supervision of Advanced Practitioner: Evaluation and management procedures were performed by the PA/NP/CNM/OB Fellow under my supervision/collaboration. Chart reviewed and agree with management and plan.  [name] V 04/19/2012 10:23 AM    

## 2012-05-16 ENCOUNTER — Encounter (HOSPITAL_COMMUNITY)
Admission: RE | Admit: 2012-05-16 | Discharge: 2012-05-16 | Disposition: A | Discharge status: Home / Self Care | Payer: BC Managed Care – PPO | Source: Ambulatory Visit | Attending: Obstetrics and Gynecology | Admitting: Obstetrics and Gynecology

## 2012-06-16 ENCOUNTER — Encounter (HOSPITAL_COMMUNITY)
Admission: RE | Admit: 2012-06-16 | Discharge: 2012-06-16 | Disposition: A | Discharge status: Home / Self Care | Payer: BC Managed Care – PPO | Source: Ambulatory Visit | Attending: Obstetrics and Gynecology | Admitting: Obstetrics and Gynecology

## 2012-06-16 DIAGNOSIS — O923 Agalactia: Secondary | ICD-10-CM | POA: Insufficient documentation

## 2012-07-15 ENCOUNTER — Encounter (HOSPITAL_COMMUNITY)
Admission: RE | Admit: 2012-07-15 | Discharge: 2012-07-15 | Disposition: A | Discharge status: Home / Self Care | Payer: BC Managed Care – PPO | Source: Ambulatory Visit | Attending: Obstetrics and Gynecology | Admitting: Obstetrics and Gynecology

## 2012-07-15 DIAGNOSIS — O923 Agalactia: Secondary | ICD-10-CM | POA: Insufficient documentation

## 2012-08-15 ENCOUNTER — Ambulatory Visit (HOSPITAL_COMMUNITY)
Admission: RE | Admit: 2012-08-15 | Discharge: 2012-08-15 | Disposition: A | Discharge status: Home / Self Care | Payer: BC Managed Care – PPO | Source: Ambulatory Visit | Attending: Obstetrics and Gynecology | Admitting: Obstetrics and Gynecology

## 2012-08-15 DIAGNOSIS — O923 Agalactia: Secondary | ICD-10-CM | POA: Insufficient documentation

## 2012-09-20 ENCOUNTER — Telehealth: Payer: Self-pay | Admitting: Adult Health

## 2012-09-20 MED ORDER — NYSTATIN-TRIAMCINOLONE 100000-0.1 UNIT/GM-% EX CREA: TOPICAL_CREAM | CUTANEOUS | Status: DC

## 2012-09-20 NOTE — Telephone Encounter (Signed)
Jamie Wang called early, has itching patch on abdomen wants some cream, will rx mytex cream 2-3 x daily prn

## 2013-02-06 ENCOUNTER — Ambulatory Visit: Payer: Self-pay | Admitting: Podiatry

## 2013-02-22 ENCOUNTER — Other Ambulatory Visit: Payer: Self-pay

## 2013-02-27 ENCOUNTER — Encounter: Payer: Self-pay | Admitting: Podiatry

## 2013-02-27 ENCOUNTER — Ambulatory Visit (INDEPENDENT_AMBULATORY_CARE_PROVIDER_SITE_OTHER): Payer: BC Managed Care – PPO | Admitting: Podiatry

## 2013-02-27 VITALS — BP 118/82 | HR 89 | Resp 16 | Ht 64.0 in | Wt 250.0 lb

## 2013-02-27 DIAGNOSIS — M722 Plantar fascial fibromatosis: Secondary | ICD-10-CM

## 2013-02-27 NOTE — Progress Notes (Signed)
Jamie Wang presents today for a followup of her plantar fasciitis bilateral she states she is about 90% better. She states that she's been wearing Hirsh tissues around the house as we have suggested.  Objective: Vital signs are stable she is alert and oriented x3. Pulses are palpable bilateral. No calf pain. She has pain on palpation to medial continued tubercle left greater than right.  Assessment: Plantar fasciitis resolving bilateral.  Plan: Continue all conservative therapies and followup with me in one month or as needed.

## 2013-05-29 ENCOUNTER — Encounter: Payer: Self-pay | Admitting: Adult Health

## 2013-05-29 ENCOUNTER — Ambulatory Visit (INDEPENDENT_AMBULATORY_CARE_PROVIDER_SITE_OTHER): Payer: BC Managed Care – PPO | Admitting: Adult Health

## 2013-05-29 ENCOUNTER — Other Ambulatory Visit (HOSPITAL_COMMUNITY)
Admission: RE | Admit: 2013-05-29 | Discharge: 2013-05-29 | Disposition: A | Discharge status: Home / Self Care | Payer: BC Managed Care – PPO | Source: Ambulatory Visit | Attending: Adult Health | Admitting: Adult Health

## 2013-05-29 VITALS — BP 122/78 | HR 74 | Ht 64.0 in | Wt 245.0 lb

## 2013-05-29 DIAGNOSIS — Z1151 Encounter for screening for human papillomavirus (HPV): Secondary | ICD-10-CM | POA: Insufficient documentation

## 2013-05-29 DIAGNOSIS — Z01419 Encounter for gynecological examination (general) (routine) without abnormal findings: Secondary | ICD-10-CM | POA: Insufficient documentation

## 2013-05-29 DIAGNOSIS — Z309 Encounter for contraceptive management, unspecified: Secondary | ICD-10-CM

## 2013-05-29 HISTORY — DX: Encounter for contraceptive management, unspecified: Z30.9

## 2013-05-29 NOTE — Progress Notes (Signed)
Patient ID: Jamie Wang, female   DOB: 04/09/1976, 38 y.o.   MRN: [id number] History of Present Illness: Jamie Wang is a 38 year old white female, married, in for a pap and physical.She is on lo loestrin and has almost no bleeding.Last pap in 10/2011 when pregnant, was negative for malignancy but was + for HPV.   Current Medications, Allergies, Past Medical History, Past Surgical History, Family History and Social History were reviewed in [facility] electronic medical record.     Review of Systems: Patient denies any headaches, blurred vision, shortness of breath, chest pain, abdominal pain, problems with bowel movements, urination, or intercourse.Has pain in feet but [place] foot doctors in [place]also has some discomfort in right hip at times.No moodiness but can be teary at times. She had labs at PCP and cholesterol was mildly elevated.    Physical Exam:BP 122/78  Pulse 74  Ht 5\' 4"  (1.626 m)  Wt 245 lb (111.131 kg)  BMI 42.03 kg/m2  Breastfeeding? No General:  Well developed, well nourished, no acute distress Skin:  Warm and dry Neck:  Midline trachea, normal thyroid Lungs; Clear to auscultation bilaterally Breast:  No dominant palpable mass, retraction, or nipple discharge Cardiovascular: Regular rate and rhythm Abdomen:  Soft, non tender, no hepatosplenomegaly, well healed C section scar Pelvic:  External genitalia is normal in appearance.  The vagina is normal in appearance. The cervix is nulliparous, pap with HPV performed.Marland Kitchen.  Uterus is felt to be normal size, shape, and contour.  No                adnexal masses or tenderness noted. 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  Impression: Yearly gyn exam Contraceptive management    Plan: Physical in 1  year Mammogram at 5040 Continue lo loestrin Number of samples 6  Lot number [id number]    Exp date 2/16

## 2013-05-29 NOTE — Patient Instructions (Signed)
Physical in  1 year Mammogram at 40 

## 2013-06-07 ENCOUNTER — Encounter: Payer: Self-pay | Admitting: Podiatry

## 2013-06-07 ENCOUNTER — Ambulatory Visit (INDEPENDENT_AMBULATORY_CARE_PROVIDER_SITE_OTHER): Payer: BC Managed Care – PPO | Admitting: Podiatry

## 2013-06-07 VITALS — BP 108/70 | HR 100 | Resp 12

## 2013-06-07 DIAGNOSIS — M722 Plantar fascial fibromatosis: Secondary | ICD-10-CM

## 2013-06-07 MED ORDER — MELOXICAM 15 MG PO TABS: 15.0000 mg | ORAL_TABLET | Freq: Every day | ORAL | Status: DC

## 2013-06-07 NOTE — Progress Notes (Signed)
She presents today for followup of her plantar fasciitis states that it has reoccurred.  Objective: Vital signs are stable she is alert and oriented x3. She has pain on palpation medial continued tubercles bilateral right greater than left.  Assessment: Plantar fasciitis bilateral.  Plan: Discussed the etiology pathology conservative versus surgical therapies. Injected the bilateral heels today with 20 mg of Kenalog each heel. Dispensed a night splint. She was scanned for orthotics. And a prescription for Mobic was dispensed.

## 2013-06-19 ENCOUNTER — Ambulatory Visit: Payer: BC Managed Care – PPO | Admitting: Podiatry

## 2013-06-20 ENCOUNTER — Other Ambulatory Visit: Payer: Self-pay | Admitting: Otolaryngology

## 2013-06-20 DIAGNOSIS — J329 Chronic sinusitis, unspecified: Secondary | ICD-10-CM

## 2013-07-05 ENCOUNTER — Inpatient Hospital Stay
Admission: RE | Admit: 2013-07-05 | Discharge: 2013-07-05 | Disposition: A | Discharge status: Home / Self Care | Payer: BC Managed Care – PPO | Source: Ambulatory Visit | Attending: Otolaryngology | Admitting: Otolaryngology

## 2013-07-27 ENCOUNTER — Encounter: Payer: Self-pay | Admitting: *Deleted

## 2013-07-27 NOTE — Progress Notes (Signed)
Patient's husband Jamie Wang was here for appointment to get his orthotics and asked if he could take his wife's pair to her. 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 Dispensed pt's orthotics to husband with instructions.

## 2013-09-17 IMAGING — US US FETAL BPP W/O NONSTRESS
1 series · 8 of 8 positions shown · non-contrast
Comparison: none

CLINICAL DATA: Decreased fetal movement.

BIOPHYSICAL PROFILE
Number of Fetuses: 1
Heart Rate: 138 bpm
Presentation: Frank breech
Movement: No
Amniotic fluid (subjective): Normal
Vertical pocket:  6.9 cm
BPP:
Movement:  0 / 2        Time:  30 minutes
Breathing: 0 / 2
Tone:   0 / 2
Amniotic Fluid:  2 / 2
Total Score:  2 / 8

[Series 1: us fetal bpp w/o nonstress · non-contrast · 8 of 8 slices shown]
[im 1/8]
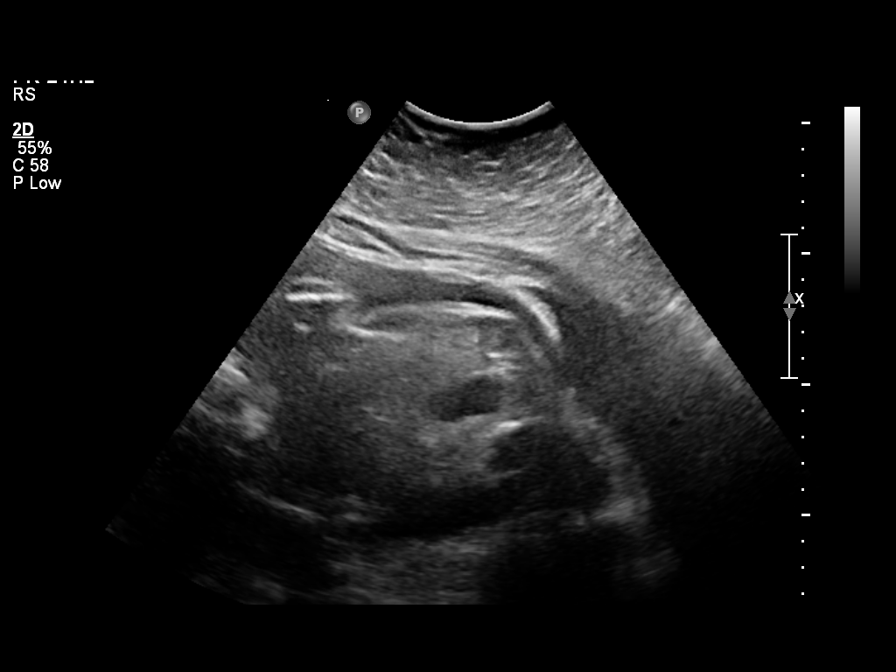
[im 2/8]
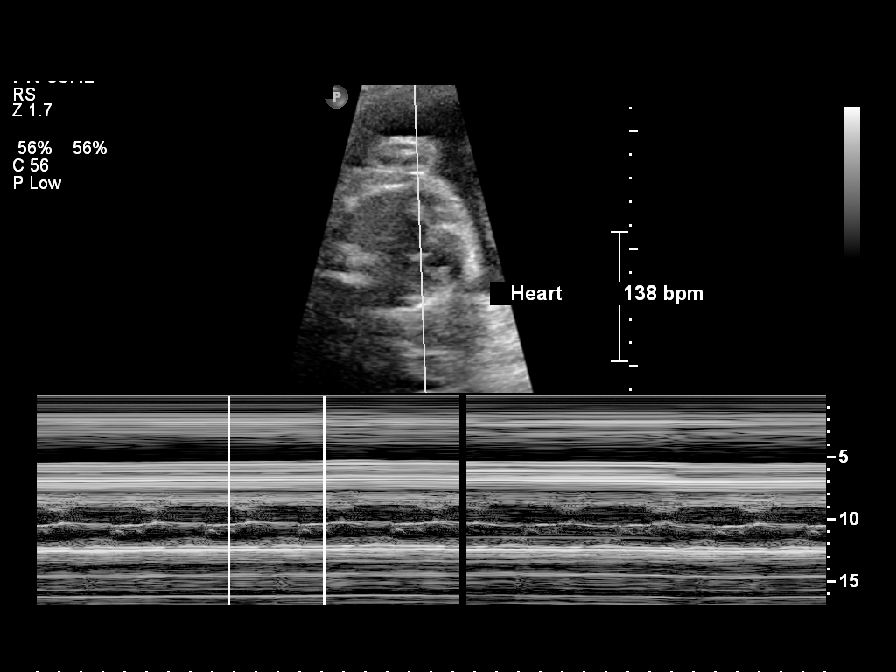
[im 3/8]
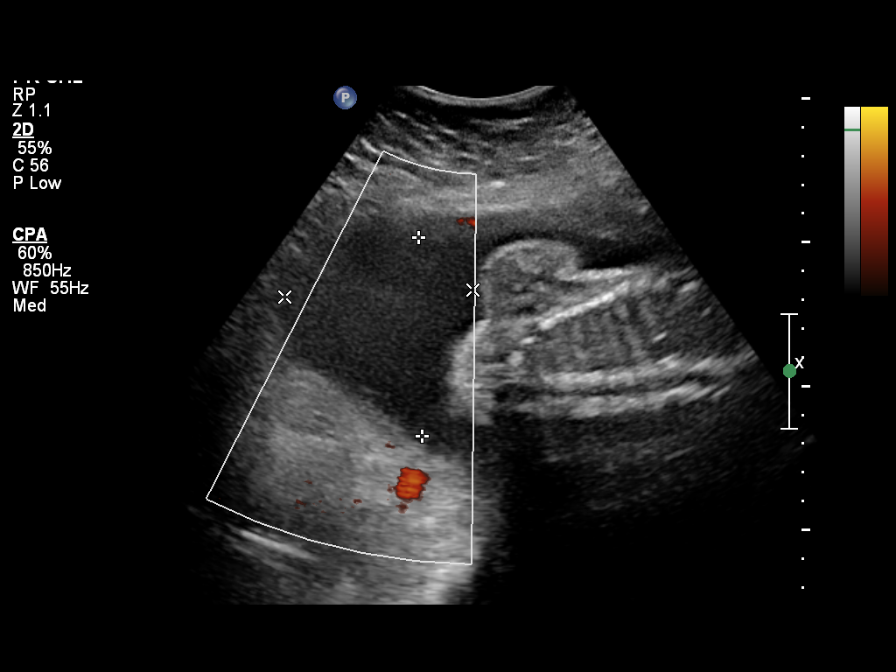
[im 4/8]
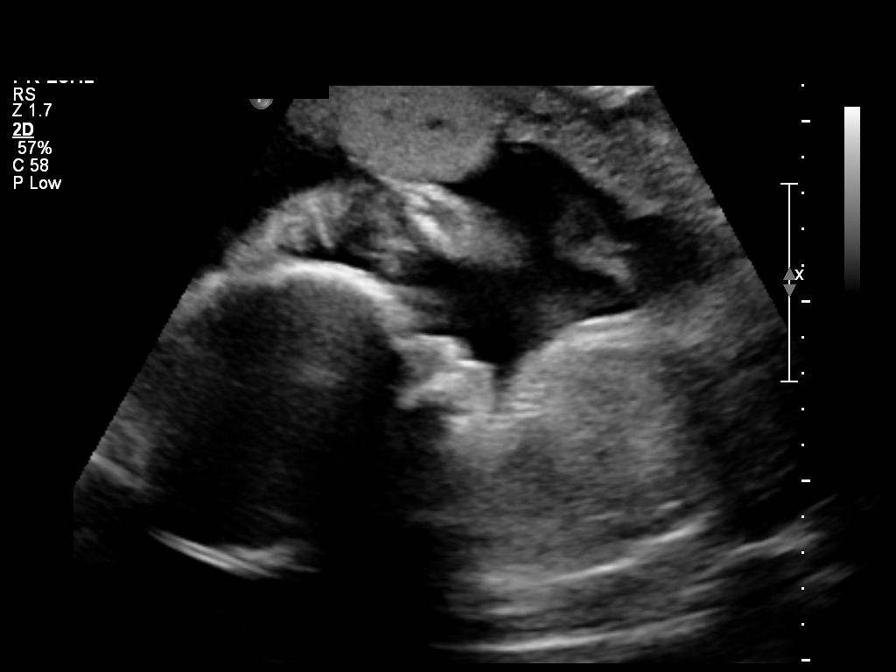
[im 5/8]
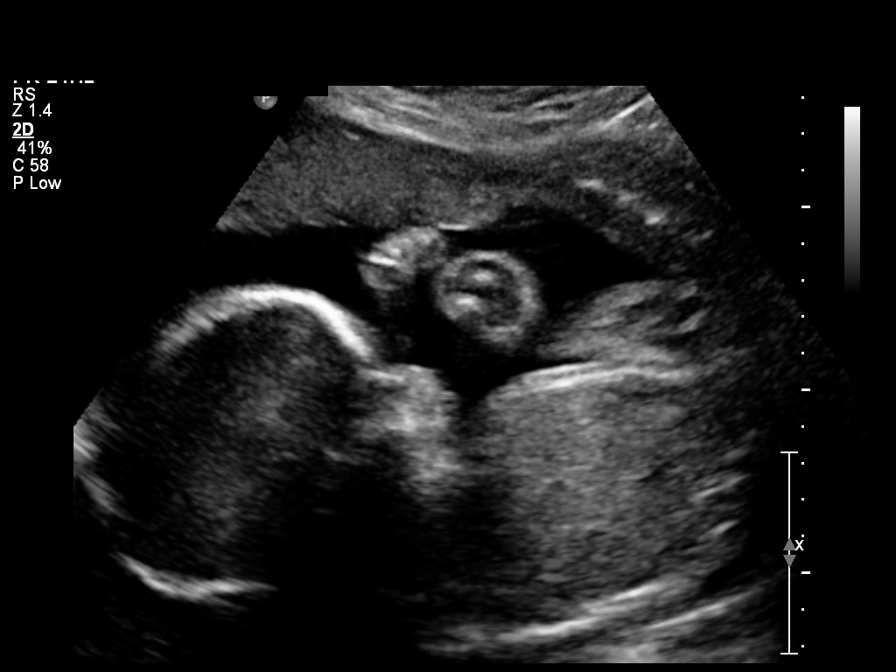
[im 6/8]
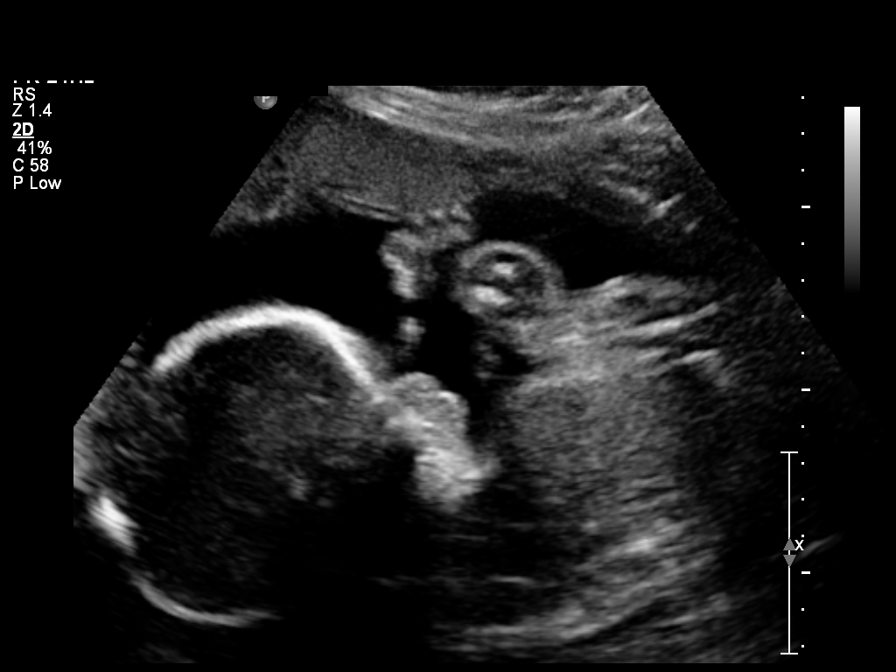
[im 7/8]
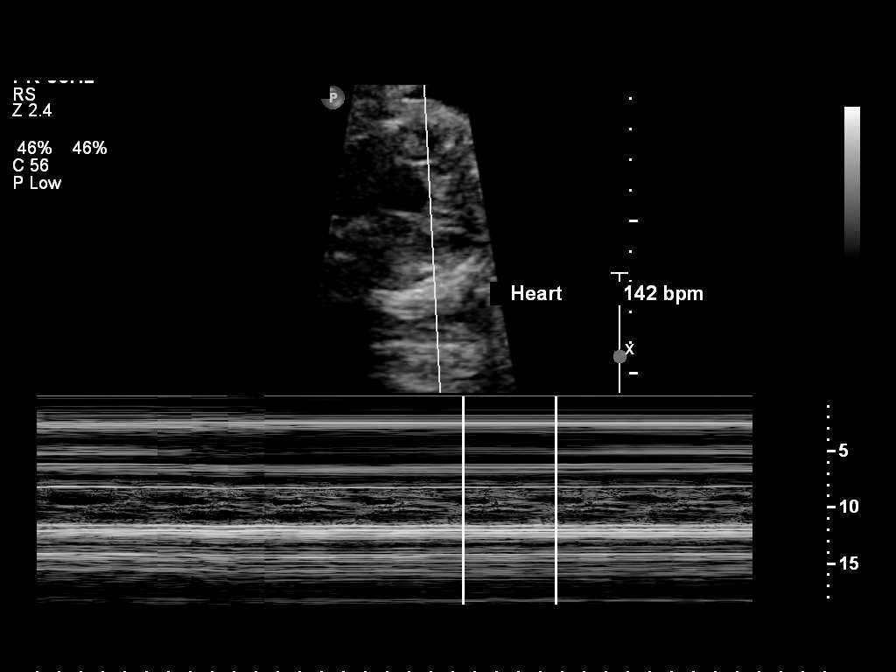
[im 8/8]
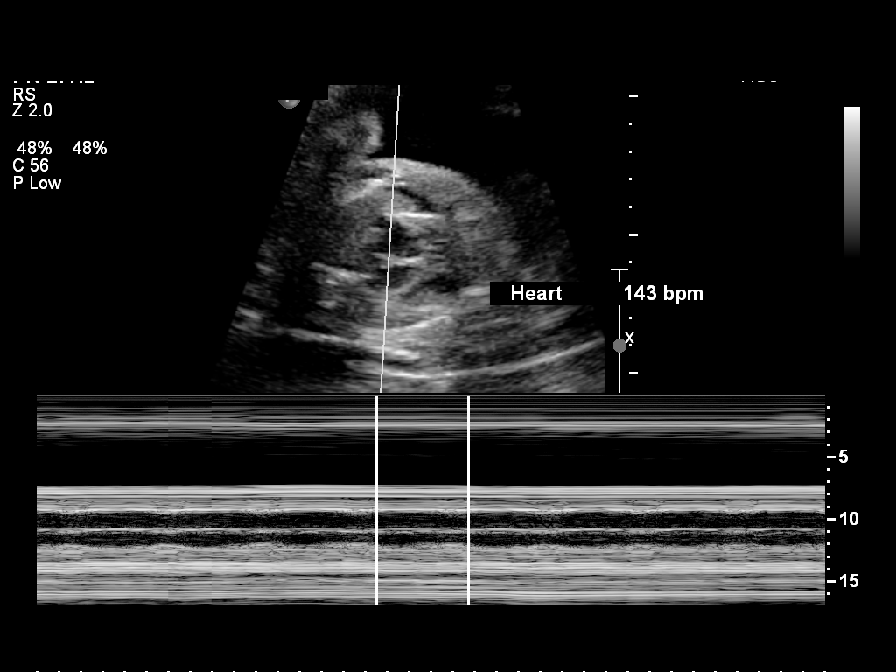

[8 of 8 positions shown; findings below may reference images not displayed]

IMPRESSION: Single live intrauterine pregnancy noted, in frank breech
presentation.  Biophysical profile score of [DATE], at 30 minutes.

Recommend followup with non-emergent complete OB 14+ wk US
examination for fetal biometric evaluation and anatomic survey if
not already performed.

to Savio Locklear CNM, who verbally acknowledged these results.

## 2013-11-06 ENCOUNTER — Encounter: Payer: Self-pay | Admitting: Podiatry

## 2013-11-06 ENCOUNTER — Ambulatory Visit (INDEPENDENT_AMBULATORY_CARE_PROVIDER_SITE_OTHER): Payer: BC Managed Care – PPO | Admitting: Podiatry

## 2013-11-06 VITALS — BP 118/83 | HR 96 | Resp 16

## 2013-11-06 DIAGNOSIS — M722 Plantar fascial fibromatosis: Secondary | ICD-10-CM

## 2013-11-06 NOTE — Progress Notes (Signed)
She presents today for a followup of her plantar fasciitis and to evaluate orthotics. He states that they seem to be doing okay him still having pain when I take him off.  Objective: Vital signs are stable she is alert and oriented x3. Orthotics appear to be fitting her feet quite nicely and she has minimal reproducible pain on palpation bilateral.   Assessment: resolving plantar fasciitis bilateral.  Plan: Continue use of the orthotics and anti-inflammatories as needed. Currently she is using a essential oils to help with her anti-inflammatory effect.

## 2014-02-18 ENCOUNTER — Encounter: Payer: Self-pay | Admitting: Podiatry

## 2014-03-30 ENCOUNTER — Emergency Department (HOSPITAL_BASED_OUTPATIENT_CLINIC_OR_DEPARTMENT_OTHER)
Admission: EM | Admit: 2014-03-30 | Discharge: 2014-03-31 | Disposition: A | Discharge status: Home / Self Care | Payer: BC Managed Care – PPO | Attending: Emergency Medicine | Admitting: Emergency Medicine

## 2014-03-30 ENCOUNTER — Encounter (HOSPITAL_BASED_OUTPATIENT_CLINIC_OR_DEPARTMENT_OTHER): Payer: Self-pay | Admitting: *Deleted

## 2014-03-30 DIAGNOSIS — T371X5A Adverse effect of antimycobacterial drugs, initial encounter: Secondary | ICD-10-CM | POA: Insufficient documentation

## 2014-03-30 DIAGNOSIS — Z79899 Other long term (current) drug therapy: Secondary | ICD-10-CM | POA: Diagnosis not present

## 2014-03-30 DIAGNOSIS — Z792 Long term (current) use of antibiotics: Secondary | ICD-10-CM | POA: Diagnosis not present

## 2014-03-30 DIAGNOSIS — Z8739 Personal history of other diseases of the musculoskeletal system and connective tissue: Secondary | ICD-10-CM | POA: Diagnosis not present

## 2014-03-30 DIAGNOSIS — L509 Urticaria, unspecified: Secondary | ICD-10-CM | POA: Insufficient documentation

## 2014-03-30 DIAGNOSIS — E669 Obesity, unspecified: Secondary | ICD-10-CM | POA: Diagnosis not present

## 2014-03-30 DIAGNOSIS — Z791 Long term (current) use of non-steroidal anti-inflammatories (NSAID): Secondary | ICD-10-CM | POA: Insufficient documentation

## 2014-03-30 DIAGNOSIS — T7840XA Allergy, unspecified, initial encounter: Secondary | ICD-10-CM

## 2014-03-30 DIAGNOSIS — R220 Localized swelling, mass and lump, head: Secondary | ICD-10-CM | POA: Diagnosis present

## 2014-03-30 MED ORDER — PREDNISONE 50 MG PO TABS
60.0000 mg | ORAL_TABLET | Freq: Once | ORAL | Status: AC
  Administered 2014-03-30: 60 mg via ORAL
  Filled 2014-03-30 (×2): qty 1

## 2014-03-30 MED ORDER — DIPHENHYDRAMINE HCL 50 MG/ML IJ SOLN
25.0000 mg | Freq: Once | INTRAMUSCULAR | Status: AC
  Administered 2014-03-30: 25 mg via INTRAMUSCULAR
  Filled 2014-03-30: qty 1

## 2014-03-30 NOTE — ED Notes (Addendum)
Allergic reaction to sulfa abx, started 11/25, refilled 12/7. Hives noted this am. Seen at urgent care this am. Stopped sulfa. Switched to doxycycline. Started hydroxyzine. Both last taken at 1630. Took allegra and zantac at 1230. Denies sob or dysphagia.  Throat red and irritated with postnasal drip. Instructed to come to ED by PCP. Swelling and redness noted to face and hands.

## 2014-03-30 NOTE — ED Provider Notes (Signed)
CSN: [id number]     Arrival date & time 03/30/14  2251 History  This chart was scribed for Katleen Carraway Smitty CordsK Marylan Glore-Rasch, MD by Ronney LionSuzanne Le, ED Scribe. This patient was seen in room MH07/MH07 and the patient's care was started at 11:16 PM.    Chief Complaint  Patient presents with  . Medication Reaction   The history is provided by the patient. No language interpreter was used.    HPI Comments: Jamie Wang is a 38 y.o. female who presents to the Emergency Department complaining of a possible allergic reaction that began earlier in the week but which worsened today. Patient had started a 10-day course of sulfur antibiotics onset 2.5 weeks ago. Five days ago, she woke up with hives. Patient noticed hives this morning and went to Urgent Care where she was told to stop her sulfur antibiotics and start doxycycline and hydroxyzine, which she took 7 hours ago. Patient's lips began to swell 3 hours later. She states that she took Allegra and Zantac this evening. Patient has chronic, copious, green, purulent, post-nasal drainage that she sees an ENT specialist for. She states that using a nasal spray in the past has caused epistaxis. Patient denies fever. She has an upcoming singing performance.    Past Medical History  Diagnosis Date  . Obesity   . Multiple allergies   . Plantar fasciitis, bilateral   . Contraceptive management 05/29/2013   Past Surgical History  Procedure Laterality Date  . Carpel tunnel release  2005    bilateral  . Cesarean section  04/13/2012    Procedure: CESAREAN SECTION;  Surgeon: Tilda BurrowJohn V Ferguson, MD;  Location: [facility];  Service: Obstetrics;  Laterality: N/A;   Family History  Problem Relation Age of Onset  . Stroke Maternal Grandmother   . Heart disease Father   . Hypertension Mother    History  Substance Use Topics  . Smoking status: Never Smoker   . Smokeless tobacco: Never Used  . Alcohol Use: No   OB History    Gravida Para Term Preterm AB TAB SAB Ectopic  Multiple Living   1 1  1      1      Review of Systems  Constitutional: Negative for fever.  HENT: Positive for facial swelling and postnasal drip. Negative for trouble swallowing.   Respiratory: Negative for shortness of breath.       Allergies  Review of patient's allergies indicates no known allergies.  Home Medications   Prior to Admission medications   Medication Sig Start Date End Date Taking? Authorizing Provider  azithromycin (ZITHROMAX) 250 MG tablet  03/29/13   Historical Provider, MD  calcium-vitamin D (OSCAL WITH D) 250-125 MG-UNIT per tablet Take 1 tablet by mouth daily.    Historical Provider, MD  cetirizine (ZYRTEC) 10 MG tablet Take 10 mg by mouth daily.    Historical Provider, MD  doxycycline (VIBRA-TABS) 100 MG tablet  05/16/13   Historical Provider, MD  fluconazole (DIFLUCAN) 100 MG tablet  03/29/13   Historical Provider, MD  ketoconazole (NIZORAL) 2 % cream  03/29/13   Historical Provider, MD  levofloxacin (LEVAQUIN) 500 MG tablet  05/31/13   Historical Provider, MD  meloxicam (MOBIC) 15 MG tablet  04/20/13   Historical Provider, MD  meloxicam (MOBIC) 15 MG tablet Take 1 tablet (15 mg total) by mouth daily. 06/07/13   Max T Hyatt, DPM  Multiple Vitamin (MULTIVITAMIN) tablet Take 1 tablet by mouth daily.    Historical Provider, MD  Natale MilchNorethin-Eth Estrad-Fe  Biphas (LO LOESTRIN FE PO) Take by mouth.    Historical Provider, MD   There were no vitals taken for this visit. Physical Exam  Constitutional: She is oriented to person, place, and time. She appears well-developed and well-nourished.  HENT:  Head: Normocephalic and atraumatic.  Right Ear: Tympanic membrane normal.  Left Ear: Tympanic membrane normal.  Nose: Nose normal.  Mouth/Throat: Oropharynx is clear and moist. No oropharyngeal exudate.  Clear colorless post-nasal drip with cobblestoning.  Eyes: EOM are normal. Pupils are equal, round, and reactive to light.  Neck: Normal range of motion. Neck supple.  No  lymph nodes in neck or supraclavicular fossa.  Cardiovascular: Normal rate, regular rhythm and normal heart sounds.   Pulmonary/Chest: Effort normal and breath sounds normal. No respiratory distress. She has no wheezes. She has no rhonchi. She has no rales.  Lungs are clear.  Abdominal: Soft. Bowel sounds are normal. There is no tenderness. There is no rebound and no guarding.  Musculoskeletal: Normal range of motion.  Lymphadenopathy:    She has no cervical adenopathy.  Neurological: She is alert and oriented to person, place, and time.  Skin: Skin is warm and dry. Rash noted.  Several hives about the wrists B  Psychiatric: She has a normal mood and affect.  Nursing note and vitals reviewed.   ED Course  Procedures (including critical care time)  DIAGNOSTIC STUDIES: Oxygen Saturation is 100% on room air, normal by my interpretation.    COORDINATION OF CARE: 11:28 PM - Discussed treatment plan with pt at bedside which includes Benadryl, Prednisone, Zantac, Zyrtec/Allergra/Claritin, and referral to allergist, and pt agreed to plan.   Labs Review Labs Reviewed - No data to display  Imaging Review No results found.   EKG Interpretation None      MDM   Final diagnoses:  None   11:37 PM: Updated patient's [facility] in the [facility] system to reflect her allergies to Bactrim and Doxycycline.  D/c antibiotics as symptoms are not bacterial.  Will start prednisone in additional to benadryl and zantac BID.  Follow up with your regular doctor  I personally performed the services described in this documentation, which was scribed in my presence. The recorded information has been reviewed and is accurate.    Jasmine AweApril K Dana Debo-Rasch, MD 03/31/14 [phone]

## 2014-03-31 ENCOUNTER — Encounter (HOSPITAL_BASED_OUTPATIENT_CLINIC_OR_DEPARTMENT_OTHER): Payer: Self-pay | Admitting: Emergency Medicine

## 2014-03-31 MED ORDER — PREDNISONE 50 MG PO TABS: 50.0000 mg | ORAL_TABLET | Freq: Every day | ORAL | Status: DC

## 2014-03-31 NOTE — Discharge Instructions (Signed)
Allergies  Allergies may happen from anything your body is sensitive to. This may be food, medicines, pollens, chemicals, and many other things. Food allergies can be severe and deadly.  HOME CARE  If you do not know what causes a reaction, keep a diary. Write down the foods you ate and the symptoms that followed. Avoid foods that cause reactions.  If you have red raised spots (hives) or a rash:  Take medicine as told by your doctor.  Use medicines for red raised spots and itching as needed.  Apply cold cloths (compresses) to the skin. Take a cool bath. Avoid hot baths or showers.  If you are severely allergic:  It is often necessary to go to the hospital after you have treated your reaction.  Wear your medical alert jewelry.  You and your family must learn how to give a allergy shot or use an allergy kit (anaphylaxis kit).  Always carry your allergy kit or shot with you. Use this medicine as told by your doctor if a severe reaction is occurring. GET HELP RIGHT AWAY IF:  You have trouble breathing or are making high-pitched whistling sounds (wheezing).  You have a tight feeling in your chest or throat.  You have a puffy (swollen) mouth.  You have red raised spots, puffiness (swelling), or itching all over your body.  You have had a severe reaction that was helped by your allergy kit or shot. The reaction can return once the medicine has worn off.  You think you are having a food allergy. Symptoms most often happen within 30 minutes of eating a food.  Your symptoms have not gone away within 2 days or are getting worse.  You have new symptoms.  You want to retest yourself with a food or drink you think causes an allergic reaction. Only do this under the care of a doctor. MAKE SURE YOU:   Understand these instructions.  Will watch your condition.  Will get help right away if you are not doing well or get worse. Document Released: 07/31/2012 Document Reviewed:  07/31/2012 [facility] Patient Information 2015 [place]. This information is not intended to replace advice given to you by your health care provider. Make sure you discuss any questions you have with your health care provider.

## 2014-04-29 ENCOUNTER — Other Ambulatory Visit: Payer: BC Managed Care – PPO

## 2014-04-29 DIAGNOSIS — Z3200 Encounter for pregnancy test, result unknown: Secondary | ICD-10-CM

## 2014-04-30 ENCOUNTER — Telehealth: Payer: Self-pay | Admitting: Adult Health

## 2014-04-30 DIAGNOSIS — Z3490 Encounter for supervision of normal pregnancy, unspecified, unspecified trimester: Secondary | ICD-10-CM

## 2014-04-30 LAB — HCG, QUANTITATIVE, PREGNANCY: hCG, Beta Chain, Quant, S: 1360 m[IU]/mL

## 2014-04-30 NOTE — Telephone Encounter (Signed)
Pt aware QHCG 1360 will get dating US in 3 weeks

## 2014-05-22 ENCOUNTER — Other Ambulatory Visit: Payer: BC Managed Care – PPO

## 2014-05-29 ENCOUNTER — Other Ambulatory Visit: Payer: BC Managed Care – PPO

## 2014-06-04 ENCOUNTER — Other Ambulatory Visit: Payer: Self-pay | Admitting: Adult Health

## 2014-06-04 ENCOUNTER — Encounter: Payer: Self-pay | Admitting: Adult Health

## 2014-06-04 ENCOUNTER — Ambulatory Visit (INDEPENDENT_AMBULATORY_CARE_PROVIDER_SITE_OTHER): Payer: BC Managed Care – PPO

## 2014-06-04 ENCOUNTER — Ambulatory Visit (INDEPENDENT_AMBULATORY_CARE_PROVIDER_SITE_OTHER): Payer: BC Managed Care – PPO | Admitting: Adult Health

## 2014-06-04 VITALS — BP 120/78 | Wt 239.5 lb

## 2014-06-04 DIAGNOSIS — O3680X1 Pregnancy with inconclusive fetal viability, fetus 1: Secondary | ICD-10-CM

## 2014-06-04 DIAGNOSIS — Z114 Encounter for screening for human immunodeficiency virus [HIV]: Secondary | ICD-10-CM

## 2014-06-04 DIAGNOSIS — Z331 Pregnant state, incidental: Secondary | ICD-10-CM

## 2014-06-04 DIAGNOSIS — Z0184 Encounter for antibody response examination: Secondary | ICD-10-CM

## 2014-06-04 DIAGNOSIS — Z1389 Encounter for screening for other disorder: Secondary | ICD-10-CM

## 2014-06-04 DIAGNOSIS — O09291 Supervision of pregnancy with other poor reproductive or obstetric history, first trimester: Secondary | ICD-10-CM

## 2014-06-04 DIAGNOSIS — O09529 Supervision of elderly multigravida, unspecified trimester: Secondary | ICD-10-CM

## 2014-06-04 DIAGNOSIS — Z3491 Encounter for supervision of normal pregnancy, unspecified, first trimester: Secondary | ICD-10-CM

## 2014-06-04 DIAGNOSIS — Z3481 Encounter for supervision of other normal pregnancy, first trimester: Secondary | ICD-10-CM

## 2014-06-04 DIAGNOSIS — Z113 Encounter for screening for infections with a predominantly sexual mode of transmission: Secondary | ICD-10-CM

## 2014-06-04 DIAGNOSIS — Z0283 Encounter for blood-alcohol and blood-drug test: Secondary | ICD-10-CM

## 2014-06-04 DIAGNOSIS — O09521 Supervision of elderly multigravida, first trimester: Secondary | ICD-10-CM

## 2014-06-04 DIAGNOSIS — Z3490 Encounter for supervision of normal pregnancy, unspecified, unspecified trimester: Secondary | ICD-10-CM

## 2014-06-04 HISTORY — DX: Encounter for supervision of normal pregnancy, unspecified, first trimester: Z34.91

## 2014-06-04 HISTORY — DX: Supervision of elderly multigravida, unspecified trimester: O09.529

## 2014-06-04 LAB — POCT URINALYSIS DIPSTICK
Glucose, UA: NEGATIVE
KETONES UA: NEGATIVE
Nitrite, UA: NEGATIVE
PROTEIN UA: NEGATIVE
RBC UA: NEGATIVE

## 2014-06-04 NOTE — Patient Instructions (Signed)
Second Trimester of Pregnancy The second trimester is from week 13 through week 28, months 4 through 6. The second trimester is often a time when you feel your best. Your body has also adjusted to being pregnant, and you begin to feel better physically. Usually, morning sickness has lessened or quit completely, you may have more energy, and you may have an increase in appetite. The second trimester is also a time when the fetus is growing rapidly. At the end of the sixth month, the fetus is about 9 inches long and weighs about 1 pounds. You will likely begin to feel the baby move (quickening) between 18 and 20 weeks of the pregnancy. BODY CHANGES Your body goes through many changes during pregnancy. The changes vary from woman to woman.   Your weight will continue to increase. You will notice your lower abdomen bulging out.  You may begin to get stretch marks on your hips, abdomen, and breasts.  You may develop headaches that can be relieved by medicines approved by your health care provider.  You may urinate more often because the fetus is pressing on your bladder.  You may develop or continue to have heartburn as a result of your pregnancy.  You may develop constipation because certain hormones are causing the muscles that push waste through your intestines to slow down.  You may develop hemorrhoids or swollen, bulging veins (varicose veins).  You may have back pain because of the weight gain and pregnancy hormones relaxing your joints between the bones in your pelvis and as a result of a shift in weight and the muscles that support your balance.  Your breasts will continue to grow and be tender.  Your gums may bleed and may be sensitive to brushing and flossing.  Dark spots or blotches (chloasma, mask of pregnancy) may develop on your face. This will likely fade after the baby is born.  A dark line from your belly button to the pubic area (linea nigra) may appear. This will likely fade  after the baby is born.  You may have changes in your hair. These can include thickening of your hair, rapid growth, and changes in texture. Some women also have hair loss during or after pregnancy, or hair that feels dry or thin. Your hair will most likely return to normal after your baby is born. WHAT TO EXPECT AT YOUR PRENATAL VISITS During a routine prenatal visit:  You will be weighed to make sure you and the fetus are growing normally.  Your blood pressure will be taken.  Your abdomen will be measured to track your baby's growth.  The fetal heartbeat will be listened to.  Any test results from the previous visit will be discussed. Your health care provider may ask you:  How you are feeling.  If you are feeling the baby move.  If you have had any abnormal symptoms, such as leaking fluid, bleeding, severe headaches, or abdominal cramping.  If you have any questions. Other tests that may be performed during your second trimester include:  Blood tests that check for:  Low iron levels (anemia).  Gestational diabetes (between 24 and 28 weeks).  Rh antibodies.  Urine tests to check for infections, diabetes, or protein in the urine.  An ultrasound to confirm the proper growth and development of the baby.  An amniocentesis to check for possible genetic problems.  Fetal screens for spina bifida and Down syndrome. HOME CARE INSTRUCTIONS   Avoid all smoking, herbs, alcohol, and unprescribed   drugs. These chemicals affect the formation and growth of the baby.  Follow your health care provider's instructions regarding medicine use. There are medicines that are either safe or unsafe to take during pregnancy.  Exercise only as directed by your health care provider. Experiencing uterine cramps is a good sign to stop exercising.  Continue to eat regular, healthy meals.  Wear a good support bra for breast tenderness.  Do not use hot tubs, steam rooms, or saunas.  Wear your  seat belt at all times when driving.  Avoid raw meat, uncooked cheese, cat litter boxes, and soil used by cats. These carry germs that can cause birth defects in the baby.  Take your prenatal vitamins.  Try taking a stool softener (if your health care provider approves) if you develop constipation. Eat more high-fiber foods, such as fresh vegetables or fruit and whole grains. Drink plenty of fluids to keep your urine clear or pale yellow.  Take warm sitz baths to soothe any pain or discomfort caused by hemorrhoids. Use hemorrhoid cream if your health care provider approves.  If you develop varicose veins, wear support hose. Elevate your feet for 15 minutes, 3-4 times a day. Limit salt in your diet.  Avoid heavy lifting, wear low heel shoes, and practice good posture.  Rest with your legs elevated if you have leg cramps or low back pain.  Visit your dentist if you have not gone yet during your pregnancy. Use a soft toothbrush to brush your teeth and be gentle when you floss.  A sexual relationship may be continued unless your health care provider directs you otherwise.  Continue to go to all your prenatal visits as directed by your health care provider. SEEK MEDICAL CARE IF:   You have dizziness.  You have mild pelvic cramps, pelvic pressure, or nagging pain in the abdominal area.  You have persistent nausea, vomiting, or diarrhea.  You have a bad smelling vaginal discharge.  You have pain with urination. SEEK IMMEDIATE MEDICAL CARE IF:   You have a fever.  You are leaking fluid from your vagina.  You have spotting or bleeding from your vagina.  You have severe abdominal cramping or pain.  You have rapid weight gain or loss.  You have shortness of breath with chest pain.  You notice sudden or extreme swelling of your face, hands, ankles, feet, or legs.  You have not felt your baby move in over an hour.  You have severe headaches that do not go away with  medicine.  You have vision changes. Document Released: 03/30/2001 Document Revised: 04/10/2013 Document Reviewed: 06/06/2012 [facility] Patient Information 2015 [place], [place]. This information is not intended to replace advice given to you by your health care provider. Make sure you discuss any questions you have with your health care provider. First Trimester of Pregnancy The first trimester of pregnancy is from week 1 until the end of week 12 (months 1 through 3). A week after a sperm fertilizes an egg, the egg will implant on the wall of the uterus. This embryo will begin to develop into a baby. Genes from you and your partner are forming the baby. The female genes determine whether the baby is a boy or a girl. At 6-8 weeks, the eyes and face are formed, and the heartbeat can be seen on ultrasound. At the end of 12 weeks, all the baby's organs are formed.  Now that you are pregnant, you will want to do everything you can to have  a healthy baby. Two of the most important things are to get good prenatal care and to follow your health care provider's instructions. Prenatal care is all the medical care you receive before the baby's birth. This care will help prevent, find, and treat any problems during the pregnancy and childbirth. BODY CHANGES Your body goes through many changes during pregnancy. The changes vary from woman to woman.   You may gain or lose a couple of pounds at first.  You may feel sick to your stomach (nauseous) and throw up (vomit). If the vomiting is uncontrollable, call your health care provider.  You may tire easily.  You may develop headaches that can be relieved by medicines approved by your health care provider.  You may urinate more often. Painful urination may mean you have a bladder infection.  You may develop heartburn as a result of your pregnancy.  You may develop constipation because certain hormones are causing the muscles that push waste through your intestines  to slow down.  You may develop hemorrhoids or swollen, bulging veins (varicose veins).  Your breasts may begin to grow larger and become tender. Your nipples may stick out more, and the tissue that surrounds them (areola) may become darker.  Your gums may bleed and may be sensitive to brushing and flossing.  Dark spots or blotches (chloasma, mask of pregnancy) may develop on your face. This will likely fade after the baby is born.  Your menstrual periods will stop.  You may have a loss of appetite.  You may develop cravings for certain kinds of food.  You may have changes in your emotions from day to day, such as being excited to be pregnant or being concerned that something may go wrong with the pregnancy and baby.  You may have more vivid and strange dreams.  You may have changes in your hair. These can include thickening of your hair, rapid growth, and changes in texture. Some women also have hair loss during or after pregnancy, or hair that feels dry or thin. Your hair will most likely return to normal after your baby is born. WHAT TO EXPECT AT YOUR PRENATAL VISITS During a routine prenatal visit:  You will be weighed to make sure you and the baby are growing normally.  Your blood pressure will be taken.  Your abdomen will be measured to track your baby's growth.  The fetal heartbeat will be listened to starting around week 10 or 12 of your pregnancy.  Test results from any previous visits will be discussed. Your health care provider may ask you:  How you are feeling.  If you are feeling the baby move.  If you have had any abnormal symptoms, such as leaking fluid, bleeding, severe headaches, or abdominal cramping.  If you have any questions. Other tests that may be performed during your first trimester include:  Blood tests to find your blood type and to check for the presence of any previous infections. They will also be used to check for low iron levels (anemia) and  Rh antibodies. Later in the pregnancy, blood tests for diabetes will be done along with other tests if problems develop.  Urine tests to check for infections, diabetes, or protein in the urine.  An ultrasound to confirm the proper growth and development of the baby.  An amniocentesis to check for possible genetic problems.  Fetal screens for spina bifida and Down syndrome.  You may need other tests to make sure you and the baby  are doing well. HOME CARE INSTRUCTIONS  Medicines  Follow your health care provider's instructions regarding medicine use. Specific medicines may be either safe or unsafe to take during pregnancy.  Take your prenatal vitamins as directed.  If you develop constipation, try taking a stool softener if your health care provider approves. Diet  Eat regular, well-balanced meals. Choose a variety of foods, such as meat or vegetable-based protein, fish, milk and low-fat dairy products, vegetables, fruits, and whole grain breads and cereals. Your health care provider will help you determine the amount of weight gain that is right for you.  Avoid raw meat and uncooked cheese. These carry germs that can cause birth defects in the baby.  Eating four or five small meals rather than three large meals a day may help relieve nausea and vomiting. If you start to feel nauseous, eating a few soda crackers can be helpful. Drinking liquids between meals instead of during meals also seems to help nausea and vomiting.  If you develop constipation, eat more high-fiber foods, such as fresh vegetables or fruit and whole grains. Drink enough fluids to keep your urine clear or pale yellow. Activity and Exercise  Exercise only as directed by your health care provider. Exercising will help you:  Control your weight.  Stay in shape.  Be prepared for labor and delivery.  Experiencing pain or cramping in the lower abdomen or low back is a good sign that you should stop exercising. Check  with your health care provider before continuing normal exercises.  Try to avoid standing for long periods of time. Move your legs often if you must stand in one place for a long time.  Avoid heavy lifting.  Wear low-heeled shoes, and practice good posture.  You may continue to have sex unless your health care provider directs you otherwise. Relief of Pain or Discomfort  Wear a good support bra for breast tenderness.   Take warm sitz baths to soothe any pain or discomfort caused by hemorrhoids. Use hemorrhoid cream if your health care provider approves.   Rest with your legs elevated if you have leg cramps or low back pain.  If you develop varicose veins in your legs, wear support hose. Elevate your feet for 15 minutes, 3-4 times a day. Limit salt in your diet. Prenatal Care  Schedule your prenatal visits by the twelfth week of pregnancy. They are usually scheduled monthly at first, then more often in the last 2 months before delivery.  Write down your questions. Take them to your prenatal visits.  Keep all your prenatal visits as directed by your health care provider. Safety  Wear your seat belt at all times when driving.  Make a list of emergency phone numbers, including numbers for family, friends, the hospital, and police and fire departments. General Tips  Ask your health care provider for a referral to a local prenatal education class. Begin classes no later than at the beginning of month 6 of your pregnancy.  Ask for help if you have counseling or nutritional needs during pregnancy. Your health care provider can offer advice or refer you to specialists for help with various needs.  Do not use hot tubs, steam rooms, or saunas.  Do not douche or use tampons or scented sanitary pads.  Do not cross your legs for long periods of time.  Avoid cat litter boxes and soil used by cats. These carry germs that can cause birth defects in the baby and possibly loss of the fetus  by miscarriage or stillbirth.  Avoid all smoking, herbs, alcohol, and medicines not prescribed by your health care provider. Chemicals in these affect the formation and growth of the baby.  Schedule a dentist appointment. At home, brush your teeth with a soft toothbrush and be gentle when you floss. SEEK MEDICAL CARE IF:   You have dizziness.  You have mild pelvic cramps, pelvic pressure, or nagging pain in the abdominal area.  You have persistent nausea, vomiting, or diarrhea.  You have a bad smelling vaginal discharge.  You have pain with urination.  You notice increased swelling in your face, hands, legs, or ankles. SEEK IMMEDIATE MEDICAL CARE IF:   You have a fever.  You are leaking fluid from your vagina.  You have spotting or bleeding from your vagina.  You have severe abdominal cramping or pain.  You have rapid weight gain or loss.  You vomit blood or material that looks like coffee grounds.  You are exposed to German measles and have never had them.  You are exposed to fifth disease or chickenpox.  You develop a severe headache.  You have shortness of breath.  You have any kind of trauma, such as from a fall or a car accident. Document Released: 03/30/2001 Document Revised: 08/20/2013 Document Reviewed: 02/13/2013 [employer] Patient Information 2015 [employer]. This information is not intended to replace advice given to you by your health care provider. Make sure you discuss any questions you have with your health care provider. Return in 4 weeks for OB visit  

## 2014-06-04 NOTE — Progress Notes (Signed)
U/S(10+1wks)-single IUP with +FCA noted, FHR-171 bpm, CRL c/w LMP dates, cx appears closed, bilateral adnexa appears WNL, posterior Gr 0 placenta

## 2014-06-04 NOTE — Progress Notes (Signed)
Subjective:  Jamie Wang is a 39 y.o. 172P0101 Caucasian female at 4973w1d by LMP being seen today for her first obstetrical visit.  Her obstetrical history is significant for advanced maternal age and past history of IUGR due to placenta, had C-section at 30+6 weeks by Dr Emelda FearFerguson.  Pregnancy history fully reviewed.  Patient reports fatigue. Denies vb, cramping, uti s/s, abnormal/malodorous vag d/c, or vulvovaginal itching/irritation.  BP 120/78 mmHg  Wt 239 lb 8 oz (108.636 kg)  LMP 03/25/2014  HISTORY: OB History  Gravida Para Term Preterm AB SAB TAB Ectopic Multiple Living  2 1  1      1     # Outcome Date GA Lbr Len/2nd Weight Sex Delivery Anes PTL Lv  2 Current           1 Preterm 04/13/12 219w6d  2 lb 6.5 oz (1.09 kg) F CS-LTranv EPI  Y     Past Medical History  Diagnosis Date  . Obesity   . Multiple allergies   . Plantar fasciitis, bilateral   . Contraceptive management 05/29/2013  . Supervision of normal pregnancy in first trimester 06/04/2014   Past Surgical History  Procedure Laterality Date  . Carpel tunnel release  2005    bilateral  . Cesarean section  04/13/2012    Procedure: CESAREAN SECTION;  Surgeon: Tilda BurrowJohn V Ferguson, MD;  Location: [facility];  Service: Obstetrics;  Laterality: N/A;   Family History  Problem Relation Age of Onset  . Stroke Maternal Grandmother   . Heart disease Father   . Hypertension Mother   . Heart attack Paternal Grandfather   . Hypertension Paternal Grandmother   . Other Paternal Grandmother     eye issues  . Congestive Heart Failure Maternal Grandfather     Exam   System:     General: Well developed & nourished, no acute distress   Skin: Warm & dry, normal coloration and turgor, no rashes   Neurologic: Alert & oriented, normal mood   Cardiovascular: Regular rate & rhythm   Respiratory: Effort & rate normal, LCTAB, acyanotic   Abdomen: Soft, non tender   Extremities: normal strength, tone                            Thyroid  normal  Pelvic Exam:    Perineum: deferred   Vulva: deferred   Vagina:  deferred   Cervix: deferred   Uterus: deferred   Thin prep pap smear 05/29/13 negative HPV. FHR:  171 via US   Assessment:   Pregnancy: G2P0101 Patient Active Problem List   Diagnosis Date Noted  . Supervision of normal pregnancy in first trimester 06/04/2014  . Contraceptive management 05/29/2013  . Status post primary low transverse cesarean section 04/16/2012  . Fetal growth restriction 04/13/2012    8373w1d G2P0101 New OB visit     Plan:  Initial labs drawn Continue prenatal vitamins Problem list reviewed and updated Reviewed n/v relief measures and warning s/s to report Reviewed recommended weight gain based on pre-gravid BMI Encouraged well-balanced diet Genetic Screening discussed Integrated Screen: declined Cystic fibrosis screening discussed declined Ultrasound discussed; fetal survey: requested Follow up in 4 weeks for for OB visit with Dr Emelda FearFerguson Handout given preparing for 9 special months  Adline PotterJennifer A. Griffin, NP 06/04/2014 4:31 PM

## 2014-06-05 ENCOUNTER — Encounter: Payer: Self-pay | Admitting: Internal Medicine

## 2014-06-05 ENCOUNTER — Ambulatory Visit (INDEPENDENT_AMBULATORY_CARE_PROVIDER_SITE_OTHER): Payer: BC Managed Care – PPO | Admitting: Internal Medicine

## 2014-06-05 ENCOUNTER — Other Ambulatory Visit: Payer: BC Managed Care – PPO

## 2014-06-05 VITALS — BP 126/70 | HR 89 | Ht 64.0 in | Wt 238.8 lb

## 2014-06-05 DIAGNOSIS — J3089 Other allergic rhinitis: Secondary | ICD-10-CM

## 2014-06-05 DIAGNOSIS — Z881 Allergy status to other antibiotic agents status: Secondary | ICD-10-CM | POA: Insufficient documentation

## 2014-06-05 DIAGNOSIS — J309 Allergic rhinitis, unspecified: Secondary | ICD-10-CM

## 2014-06-05 LAB — ANTIBODY SCREEN: Antibody Screen: NEGATIVE

## 2014-06-05 LAB — PMP SCREEN PROFILE (10S), URINE
AMPHETAMINE SCRN UR: NEGATIVE ng/mL
BENZODIAZEPINE SCREEN, URINE: NEGATIVE ng/mL
Barbiturate Screen, Ur: NEGATIVE ng/mL
CANNABINOIDS UR QL SCN: NEGATIVE ng/mL
Cocaine(Metab.)Screen, Urine: NEGATIVE ng/mL
Creatinine(Crt), U: 122 mg/dL (ref 20.0–300.0)
Methadone Scn, Ur: NEGATIVE ng/mL
Opiate Scrn, Ur: NEGATIVE ng/mL
Oxycodone+Oxymorphone Ur Ql Scn: NEGATIVE ng/mL
PCP SCRN UR: NEGATIVE ng/mL
Ph of Urine: 5.5 (ref 4.5–8.9)
Propoxyphene, Screen: NEGATIVE ng/mL

## 2014-06-05 LAB — RUBELLA SCREEN: RUBELLA: 3.23 {index} (ref >0.99)

## 2014-06-05 LAB — RPR: RPR Ser Ql: NONREACTIVE

## 2014-06-05 LAB — URINALYSIS, ROUTINE W REFLEX MICROSCOPIC
Bilirubin, UA: NEGATIVE
Glucose, UA: NEGATIVE
Ketones, UA: NEGATIVE
LEUKOCYTES UA: NEGATIVE
NITRITE UA: NEGATIVE
PH UA: 5.5 (ref 5.0–7.5)
PROTEIN UA: NEGATIVE
RBC, UA: NEGATIVE
SPEC GRAV UA: 1.019 (ref 1.005–1.030)
UUROB: 0.2 mg/dL (ref 0.2–1.0)

## 2014-06-05 LAB — CBC
HCT: 39.3 % (ref 34.0–46.6)
Hemoglobin: 13.2 g/dL (ref 11.1–15.9)
MCH: 29.8 pg (ref 26.6–33.0)
MCHC: 33.6 g/dL (ref 31.5–35.7)
MCV: 89 fL (ref 79–97)
Platelets: 216 10*3/uL (ref 150–379)
RBC: 4.43 x10E6/uL (ref 3.77–5.28)
RDW: 13.4 % (ref 12.3–15.4)
WBC: 5.5 10*3/uL (ref 3.4–10.8)

## 2014-06-05 LAB — ABO/RH: Rh Factor: POSITIVE

## 2014-06-05 LAB — VARICELLA ZOSTER ANTIBODY, IGG: VARICELLA: 829 {index} (ref >165)

## 2014-06-05 LAB — HIV ANTIBODY (ROUTINE TESTING W REFLEX): HIV Screen 4th Generation wRfx: NONREACTIVE

## 2014-06-05 LAB — HEPATITIS B SURFACE ANTIGEN: HEP B S AG: NEGATIVE

## 2014-06-05 NOTE — Progress Notes (Signed)
06/05/14- 38 yoF referred courtesy of [facility]-had reaction to abx; Also patient states she has trouble in general with allergies-ears stayed clogged. Pt is currently pregnant. 03/30/14- hives after sulfa drug Hx chronic sinusitis managed by ENT/ Dr Emeline Darling In November, 2015 she was on a sulfa drug and developed hives while being treated for sinusitis. Sulfa was changed to doxycycline but hives persisted, lipid got swollen and she went to emergency room for Benadryl and prednisone treatment. Had seen ENT once in November without finding sinusitis. Her primary complaint is perennial nasal stuffiness and postnasal drainage. This will clear for a few days with antibiotic and then return once the antibiotic is stopped. Has inhaler for cough, rarely used. No formal diagnosis of asthma but it runs in the family. She has been married 2 years. These symptoms developed while she was in college in bone [place] and persisted while living in [place]. 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 Pregnant now due in September. Environment: House, dry/no mold, no pets, no smokers  Prior to Admission medications   Medication Sig Start Date End Date Taking? Authorizing Provider  loratadine (CLARITIN) 10 MG tablet Take 10 mg by mouth daily.   Yes Historical Provider, MD  Prenatal Vit-Fe Fumarate-FA (PRENATAL VITAMIN PO) Take by mouth daily.   Yes Historical Provider, MD   Past Medical History  Diagnosis Date  . Obesity   . Multiple allergies   . Plantar fasciitis, bilateral   . Contraceptive management 05/29/2013  . Supervision of normal pregnancy in first trimester 06/04/2014  . AMA (advanced maternal age) multigravida 35+ 06/04/2014   Past Surgical History  Procedure Laterality Date  . Carpel tunnel release  2005    bilateral  . Cesarean section  04/13/2012    Procedure: CESAREAN SECTION;  Surgeon: Tilda Burrow, MD;   Location: [facility];  Service: Obstetrics;  Laterality: N/A;   Family History  Problem Relation Age of Onset  . Stroke Maternal Grandmother   . Heart disease Father   . Hypertension Mother   . Heart attack Paternal Grandfather   . Hypertension Paternal Grandmother   . Other Paternal Grandmother     eye issues  . Congestive Heart Failure Maternal Grandfather    History   Social History  . Marital Status: Married    Spouse Name: N/A  . Number of Children: 1  . Years of Education: N/A   Occupational History  . teacher    Social History Main Topics  . Smoking status: Never Smoker   . Smokeless tobacco: Never Used  . Alcohol Use: No  . Drug Use: No  . Sexual Activity: Yes    Birth Control/ Protection: None   Other Topics Concern  . Not on file   Social History Narrative   ROS-see HPI Constitutional:   No-   weight loss, night sweats, fevers, chills, fatigue, lassitude. HEENT:   No-  headaches, difficulty swallowing, tooth/dental problems, sore throat,       No-  sneezing, itching, ear ache, +nasal congestion, post nasal drip, +                           pressure in left ear  CV:  No-   chest pain, orthopnea, PND, swelling in lower extremities, anasarca,  dizziness, palpitations Resp: No-   shortness of breath with exertion or at rest.              +  productive cough,  No non-productive cough,  No- coughing up of blood.              No-   change in color of mucus.  No- wheezing.   Skin: No-   rash or lesions. GI:  No-   heartburn, indigestion, abdominal pain, nausea, vomiting, diarrhea,                 change in bowel habits, loss of appetite GU: No-   dysuria, change in color of urine, no urgency or frequency.  No- flank pain. MS:  No-   joint pain or swelling.  No- decreased range of motion.  No- back pain. Neuro-     nothing unusual Psych:  No- change in mood or affect. No depression or anxiety.  No memory loss.  OBJ- Physical Exam   pregnant due in 7 months General- Alert, Oriented, Affect-appropriate, Distress- none acute Skin- rash-none, lesions- none, excoriation- none Lymphadenopathy- none Head- atraumatic            Eyes- Gross vision intact, PERRLA, conjunctivae and secretions clear            Ears- Hearing, canals-normal            Nose- Clear, no-Septal dev, mucus, polyps, erosion, perforation             Throat- Mallampati III , mucosa clear , drainage- none, tonsils + present Neck- flexible , trachea midline, no stridor , thyroid nl, carotid no bruit Chest - symmetrical excursion , unlabored           Heart/CV- RRR , no murmur , no gallop  , no rub, nl s1 s2                           - JVD- none , edema- none, stasis changes- none, varices- none           Lung- clear to P&A, wheeze- none, cough-+ light , dullness-none, rub- none           Chest wall-  Abd- tender-no, distended-no, bowel sounds-present, HSM- no Br/ Gen/ Rectal- Not done, not indicated Extrem- cyanosis- none, clubbing, none, atrophy- none, strength- nl Neuro- grossly intact to observation

## 2014-06-05 NOTE — Assessment & Plan Note (Signed)
Plan-allergy profile. Try Nasalcrom nose spray and keep lining of nose moist with saline gel as needed

## 2014-06-05 NOTE — Patient Instructions (Addendum)
Order- lab- Allergy profile    Dx perennial allergic rhinitis  Try otc nasal spray Nasalcrom/ cromolyn/ cromol  For dry nose try an otc nasal saline gel   Strict avoidance of sulfa drugs

## 2014-06-05 NOTE — Assessment & Plan Note (Signed)
She is satisfied to manage this by avoiding the drug.

## 2014-06-06 LAB — URINE CULTURE

## 2014-06-06 LAB — ALLERGY FULL PROFILE
Allergen, D pternoyssinus,d7: <0.1 kU/L
Allergen,Goose feathers, e70: <0.1 kU/L
Alternaria Alternata: <0.1 kU/L
Aspergillus fumigatus, m3: <0.1 kU/L
Bahia Grass: <0.1 kU/L
Bermuda Grass: <0.1 kU/L
Box Elder IgE: <0.1 kU/L
Candida Albicans: <0.1 kU/L
Cat Dander: <0.1 kU/L
Common Ragweed: <0.1 kU/L
D. farinae: <0.1 kU/L
Dog Dander: <0.1 kU/L
G005 Rye, Perennial: <0.1 kU/L
Goldenrod: <0.1 kU/L
Helminthosporium halodes: <0.1 kU/L
House Dust Hollister: <0.1 kU/L
IgE (Immunoglobulin E), Serum: 20 kU/L (ref <115)
Plantain: <0.1 kU/L
Stemphylium Botryosum: <0.1 kU/L
Sycamore Tree: <0.1 kU/L

## 2014-06-11 ENCOUNTER — Telehealth: Payer: Self-pay | Admitting: Internal Medicine

## 2014-06-11 NOTE — Telephone Encounter (Signed)
Notes Recorded by Waymon Budgelinton D Young, MD on 06/07/2014 at 1:30 PM Allergy antibodies are not elevated for any of the common allergens on the panel. That suggests that symptoms might not be from allergy. Because pregnancy can cause changes in the immune system, we can consider re-evaluating later, after her baby is born, if needed. Meanwhile ok to try the nasalcrom nasal spray we discussed at her office visit.  Spoke with pt and notified of results per Dr. Maple HudsonYoung. Pt verbalized understanding and denied any questions.

## 2014-06-11 NOTE — Progress Notes (Signed)
Quick Note:  Spoke with pt and notified of results per Dr. Young. Pt verbalized understanding and denied any questions.  ______ 

## 2014-07-03 ENCOUNTER — Encounter: Payer: Self-pay | Admitting: Obstetrics and Gynecology

## 2014-07-03 ENCOUNTER — Ambulatory Visit (INDEPENDENT_AMBULATORY_CARE_PROVIDER_SITE_OTHER): Payer: BC Managed Care – PPO | Admitting: Obstetrics and Gynecology

## 2014-07-03 DIAGNOSIS — Z3492 Encounter for supervision of normal pregnancy, unspecified, second trimester: Secondary | ICD-10-CM

## 2014-07-03 DIAGNOSIS — Z1389 Encounter for screening for other disorder: Secondary | ICD-10-CM

## 2014-07-03 DIAGNOSIS — Z331 Pregnant state, incidental: Secondary | ICD-10-CM

## 2014-07-03 LAB — POCT URINALYSIS DIPSTICK
Blood, UA: NEGATIVE
Glucose, UA: NEGATIVE
Ketones, UA: NEGATIVE
Leukocytes, UA: NEGATIVE
Nitrite, UA: NEGATIVE
Protein, UA: NEGATIVE

## 2014-07-03 NOTE — Progress Notes (Signed)
Pt denies any problems or concerns at this time.  

## 2014-07-03 NOTE — Progress Notes (Signed)
Patient ID: Drenda FreezeKimberly Gassert, female   DOB: 11/04/1975, 39 y.o.   MRN: [id number]  This chart was SCRIBED for Christin BachJohn Jerzee Jerome, MD by Ronney LionSuzanne Le, ED Scribe. This patient was seen in room 1 and the patient's care was started at 4:18 PM.   [id number] 6574w2d Estimated Date of Delivery: 12/30/14  Blood pressure 110/66, pulse 80, weight 238 lb (107.956 kg), last menstrual period 03/25/2014.   refer to the ob flow sheet for FH and FHR, also BP, Wt, Urine results:notable for negative   Patient reports  Too early for good fetal movement, denies any bleeding and no rupture of membranes symptoms or regular contractions. Patient complaints: Patient has no complaints at this time. U/s shows fetal movement and fetal cardiac activity Questions were answered. Assessment:  Plan:  Continued routine obstetrical care          Pt not desiring any genetic testing, so will delay detail anatomy scan to 20-22 wk for improved detail. F/u in 4 weeks for routine OB visit    I personally performed the services described in this documentation, which was SCRIBED in my presence. The recorded information has been reviewed and considered accurate. It has been edited as necessary during review. Tilda BurrowFERGUSON,Khilynn Borntreger V, MD

## 2014-07-31 ENCOUNTER — Ambulatory Visit (INDEPENDENT_AMBULATORY_CARE_PROVIDER_SITE_OTHER): Payer: BC Managed Care – PPO | Admitting: Obstetrics & Gynecology

## 2014-07-31 VITALS — BP 112/68 | HR 80 | Wt 244.0 lb

## 2014-07-31 DIAGNOSIS — Z3481 Encounter for supervision of other normal pregnancy, first trimester: Secondary | ICD-10-CM

## 2014-07-31 DIAGNOSIS — Z3491 Encounter for supervision of normal pregnancy, unspecified, first trimester: Secondary | ICD-10-CM

## 2014-07-31 DIAGNOSIS — Z1389 Encounter for screening for other disorder: Secondary | ICD-10-CM

## 2014-07-31 DIAGNOSIS — Z363 Encounter for antenatal screening for malformations: Secondary | ICD-10-CM

## 2014-07-31 DIAGNOSIS — Z331 Pregnant state, incidental: Secondary | ICD-10-CM

## 2014-07-31 LAB — POCT URINALYSIS DIPSTICK
Blood, UA: NEGATIVE
Glucose, UA: NEGATIVE
Ketones, UA: NEGATIVE
Leukocytes, UA: NEGATIVE
NITRITE UA: NEGATIVE
Protein, UA: NEGATIVE

## 2014-07-31 NOTE — Progress Notes (Signed)
[id number] 245w2d Estimated Date of Delivery: 12/30/14  Blood pressure 112/68, pulse 80, weight 244 lb (110.678 kg), last menstrual period 03/25/2014.   BP weight and urine results all reviewed and noted.  Please refer to the obstetrical flow sheet for the fundal height and fetal heart rate documentation:  Patient reports good fetal movement, denies any bleeding and no rupture of membranes symptoms or regular contractions. Patient is without complaints. All questions were answered.  Plan:  Continued routine obstetrical care,   Follow up in 2 weeks for OB appointment, sonogram

## 2014-07-31 NOTE — Progress Notes (Signed)
[id number] 161w2d Estimated Date of Delivery: 12/30/14  Blood pressure 112/68, pulse 80, weight 244 lb (110.678 kg), last menstrual period 03/25/2014.   BP weight and urine results all reviewed and noted.  Please refer to the obstetrical flow sheet for the fundal height and fetal heart rate documentation:  Patient reports good fetal movement, denies any bleeding and no rupture of membranes symptoms or regular contractions. Patient is without complaints. All questions were answered.  Plan:  Continued routine obstetrical care,   Follow up in 2 weeks for OB appointment, anatomy scan

## 2014-07-31 NOTE — Addendum Note (Signed)
Addended by: Criss AlvinePULLIAM, CHRYSTAL G on: 07/31/2014 04:43 PM   Modules accepted: Orders

## 2014-08-01 ENCOUNTER — Encounter: Payer: Self-pay | Admitting: Internal Medicine

## 2014-08-01 ENCOUNTER — Ambulatory Visit (INDEPENDENT_AMBULATORY_CARE_PROVIDER_SITE_OTHER): Payer: BC Managed Care – PPO | Admitting: Internal Medicine

## 2014-08-01 VITALS — BP 130/78 | HR 110 | Ht 64.0 in | Wt 242.4 lb

## 2014-08-01 DIAGNOSIS — J3089 Other allergic rhinitis: Secondary | ICD-10-CM

## 2014-08-01 DIAGNOSIS — J309 Allergic rhinitis, unspecified: Secondary | ICD-10-CM

## 2014-08-01 NOTE — Progress Notes (Signed)
06/05/14- 38 yoF referred courtesy of [place]-had reaction to abx; Also patient states she has trouble in general with allergies-ears stayed clogged. Pt is currently pregnant. 03/30/14- hives after sulfa drug Hx chronic sinusitis managed by ENT/ Dr Emeline Darling In November, 2015 she was on a sulfa drug and developed hives while being treated for sinusitis. Sulfa was changed to doxycycline but hives persisted, lips got swollen and she went to emergency room for Benadryl and prednisone treatment. Had seen ENT once in November without finding sinusitis. Her primary complaint is perennial nasal stuffiness and postnasal drainage. This will clear for a few days with antibiotic and then return once the antibiotic is stopped. Has inhaler for cough, rarely used. No formal diagnosis of asthma but it runs in the family. She has been married 2 years. These symptoms developed while she was in college in [place], [place] and persisted while living in [place]. 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 Pregnant now due in September. Environment: House, dry/no mold, no pets, no smokers  08/01/14- 38 yoF followed for chronic rhinitis, hx urticaria,  FOLLOWS FOR: Discuss labs. No complaints.  Allergy profile 06/05/14- Total IgE 20, negative for common environmental allergens Pregnant, due in September. Nasalcrom helps rhinitis but used irregularly. Denies postnasal drainage or sneezing, mostly some stuffiness. No recent hives.  ROS-see HPI Constitutional:   No-   weight loss, night sweats, fevers, chills, fatigue, lassitude. HEENT:   No-  headaches, difficulty swallowing, tooth/dental problems, sore throat,       No-  sneezing, itching, ear ache, +nasal congestion, post nasal drip, +                           pressure in left ear  CV:  No-   chest pain, orthopnea, PND, swelling in lower extremities, anasarca,                                   dizziness, palpitations Resp: No-   shortness of breath with exertion or at rest.               productive cough,  No non-productive cough,  No- coughing up of blood.              No-   change in color of mucus.  No- wheezing.   Skin: No-   rash or lesions. GI:  No-   heartburn, indigestion, abdominal pain, nausea, vomiting,  GU:  MS:  No-   joint pain or swelling.   Neuro-     nothing unusual Psych:  No- change in mood or affect. No depression or anxiety.  No memory loss.  OBJ- Physical Exam  pregnant  General- Alert, Oriented, Affect-appropriate, Distress- none acute Skin- rash-none, lesions- none, excoriation- none Lymphadenopathy- none Head- atraumatic            Eyes- Gross vision intact, PERRLA, conjunctivae and secretions clear            Ears- Hearing, canals-normal            Nose- Clear, no-Septal dev, mucus, polyps, erosion, perforation             Throat- Mallampati III , mucosa clear , drainage- none, tonsils + present Neck- flexible , trachea midline, no stridor , thyroid nl, carotid no  bruit Chest - symmetrical excursion , unlabored           Heart/CV- RRR , no murmur , no gallop  , no rub, nl s1 s2                           - JVD- none , edema- none, stasis changes- none, varices- none           Lung- clear to P&A, wheeze- none, cough-none , dullness-none, rub- none           Chest wall-  Abd-  Br/ Gen/ Rectal- Not done, not indicated Extrem- cyanosis- none, clubbing, none, atrophy- none, strength- nl Neuro- grossly intact to observation

## 2014-08-01 NOTE — Patient Instructions (Signed)
Ok to continue nasalcrom and nasal saline gel as needed for the non-allergic rhinitis  Please call if needed

## 2014-08-03 NOTE — Assessment & Plan Note (Signed)
Immune system affected by pregnancy but she does not seem to be having significant seasonal rhinitis consistent with pollen. She may have some rhinitis of pregnancy. Plan-okay to continue Nasalcrom if it seems to help. Can also use nasal saline gel.

## 2014-08-14 ENCOUNTER — Other Ambulatory Visit: Payer: Self-pay | Admitting: Advanced Practice Midwife

## 2014-08-14 DIAGNOSIS — Z363 Encounter for antenatal screening for malformations: Secondary | ICD-10-CM

## 2014-08-14 DIAGNOSIS — O99212 Obesity complicating pregnancy, second trimester: Secondary | ICD-10-CM

## 2014-08-15 ENCOUNTER — Ambulatory Visit (INDEPENDENT_AMBULATORY_CARE_PROVIDER_SITE_OTHER): Payer: BC Managed Care – PPO

## 2014-08-15 ENCOUNTER — Ambulatory Visit (INDEPENDENT_AMBULATORY_CARE_PROVIDER_SITE_OTHER): Payer: BC Managed Care – PPO | Admitting: Women's Health

## 2014-08-15 ENCOUNTER — Encounter: Payer: Self-pay | Admitting: Women's Health

## 2014-08-15 VITALS — BP 108/78 | HR 72 | Wt 243.0 lb

## 2014-08-15 DIAGNOSIS — Z36 Encounter for antenatal screening of mother: Secondary | ICD-10-CM | POA: Diagnosis not present

## 2014-08-15 DIAGNOSIS — Z3491 Encounter for supervision of normal pregnancy, unspecified, first trimester: Secondary | ICD-10-CM

## 2014-08-15 DIAGNOSIS — Z363 Encounter for antenatal screening for malformations: Secondary | ICD-10-CM

## 2014-08-15 DIAGNOSIS — O99212 Obesity complicating pregnancy, second trimester: Secondary | ICD-10-CM

## 2014-08-15 DIAGNOSIS — Z3492 Encounter for supervision of normal pregnancy, unspecified, second trimester: Secondary | ICD-10-CM

## 2014-08-15 DIAGNOSIS — Z1389 Encounter for screening for other disorder: Secondary | ICD-10-CM

## 2014-08-15 DIAGNOSIS — O34219 Maternal care for unspecified type scar from previous cesarean delivery: Secondary | ICD-10-CM | POA: Insufficient documentation

## 2014-08-15 DIAGNOSIS — Z8759 Personal history of other complications of pregnancy, childbirth and the puerperium: Secondary | ICD-10-CM

## 2014-08-15 DIAGNOSIS — Z369 Encounter for antenatal screening, unspecified: Secondary | ICD-10-CM

## 2014-08-15 DIAGNOSIS — O09522 Supervision of elderly multigravida, second trimester: Secondary | ICD-10-CM

## 2014-08-15 DIAGNOSIS — Z331 Pregnant state, incidental: Secondary | ICD-10-CM

## 2014-08-15 LAB — POCT URINALYSIS DIPSTICK
GLUCOSE UA: NEGATIVE
Ketones, UA: NEGATIVE
Leukocytes, UA: NEGATIVE
NITRITE UA: NEGATIVE
Protein, UA: NEGATIVE
RBC UA: NEGATIVE

## 2014-08-15 NOTE — Patient Instructions (Signed)
Second Trimester of Pregnancy The second trimester is from week 13 through week 28, months 4 through 6. The second trimester is often a time when you feel your best. Your body has also adjusted to being pregnant, and you begin to feel better physically. Usually, morning sickness has lessened or quit completely, you may have more energy, and you may have an increase in appetite. The second trimester is also a time when the fetus is growing rapidly. At the end of the sixth month, the fetus is about 9 inches long and weighs about 1 pounds. You will likely begin to feel the baby move (quickening) between 18 and 20 weeks of the pregnancy. BODY CHANGES Your body goes through many changes during pregnancy. The changes vary from woman to woman.   Your weight will continue to increase. You will notice your lower abdomen bulging out.  You may begin to get stretch marks on your hips, abdomen, and breasts.  You may develop headaches that can be relieved by medicines approved by your health care provider.  You may urinate more often because the fetus is pressing on your bladder.  You may develop or continue to have heartburn as a result of your pregnancy.  You may develop constipation because certain hormones are causing the muscles that push waste through your intestines to slow down.  You may develop hemorrhoids or swollen, bulging veins (varicose veins).  You may have back pain because of the weight gain and pregnancy hormones relaxing your joints between the bones in your pelvis and as a result of a shift in weight and the muscles that support your balance.  Your breasts will continue to grow and be tender.  Your gums may bleed and may be sensitive to brushing and flossing.  Dark spots or blotches (chloasma, mask of pregnancy) may develop on your face. This will likely fade after the baby is born.  A dark line from your belly button to the pubic area (linea nigra) may appear. This will likely fade  after the baby is born.  You may have changes in your hair. These can include thickening of your hair, rapid growth, and changes in texture. Some women also have hair loss during or after pregnancy, or hair that feels dry or thin. Your hair will most likely return to normal after your baby is born. WHAT TO EXPECT AT YOUR PRENATAL VISITS During a routine prenatal visit:  You will be weighed to make sure you and the fetus are growing normally.  Your blood pressure will be taken.  Your abdomen will be measured to track your baby's growth.  The fetal heartbeat will be listened to.  Any test results from the previous visit will be discussed. Your health care provider may ask you:  How you are feeling.  If you are feeling the baby move.  If you have had any abnormal symptoms, such as leaking fluid, bleeding, severe headaches, or abdominal cramping.  If you have any questions. Other tests that may be performed during your second trimester include:  Blood tests that check for:  Low iron levels (anemia).  Gestational diabetes (between 24 and 28 weeks).  Rh antibodies.  Urine tests to check for infections, diabetes, or protein in the urine.  An ultrasound to confirm the proper growth and development of the baby.  An amniocentesis to check for possible genetic problems.  Fetal screens for spina bifida and Down syndrome. HOME CARE INSTRUCTIONS   Avoid all smoking, herbs, alcohol, and unprescribed   drugs. These chemicals affect the formation and growth of the baby.  Follow your health care provider's instructions regarding medicine use. There are medicines that are either safe or unsafe to take during pregnancy.  Exercise only as directed by your health care provider. Experiencing uterine cramps is a good sign to stop exercising.  Continue to eat regular, healthy meals.  Wear a good support bra for breast tenderness.  Do not use hot tubs, steam rooms, or saunas.  Wear your  seat belt at all times when driving.  Avoid raw meat, uncooked cheese, cat litter boxes, and soil used by cats. These carry germs that can cause birth defects in the baby.  Take your prenatal vitamins.  Try taking a stool softener (if your health care provider approves) if you develop constipation. Eat more high-fiber foods, such as fresh vegetables or fruit and whole grains. Drink plenty of fluids to keep your urine clear or pale yellow.  Take warm sitz baths to soothe any pain or discomfort caused by hemorrhoids. Use hemorrhoid cream if your health care provider approves.  If you develop varicose veins, wear support hose. Elevate your feet for 15 minutes, 3-4 times a day. Limit salt in your diet.  Avoid heavy lifting, wear low heel shoes, and practice good posture.  Rest with your legs elevated if you have leg cramps or low back pain.  Visit your dentist if you have not gone yet during your pregnancy. Use a soft toothbrush to brush your teeth and be gentle when you floss.  A sexual relationship may be continued unless your health care provider directs you otherwise.  Continue to go to all your prenatal visits as directed by your health care provider. SEEK MEDICAL CARE IF:   You have dizziness.  You have mild pelvic cramps, pelvic pressure, or nagging pain in the abdominal area.  You have persistent nausea, vomiting, or diarrhea.  You have a bad smelling vaginal discharge.  You have pain with urination. SEEK IMMEDIATE MEDICAL CARE IF:   You have a fever.  You are leaking fluid from your vagina.  You have spotting or bleeding from your vagina.  You have severe abdominal cramping or pain.  You have rapid weight gain or loss.  You have shortness of breath with chest pain.  You notice sudden or extreme swelling of your face, hands, ankles, feet, or legs.  You have not felt your baby move in over an hour.  You have severe headaches that do not go away with  medicine.  You have vision changes. Document Released: 03/30/2001 Document Revised: 04/10/2013 Document Reviewed: 06/06/2012 Exit[employer] Patient Information 2015 [employer]. This information is not intended to replace advice given to you by your health care provider. Make sure you discuss any questions you have with your health care provider.  

## 2014-08-15 NOTE — Progress Notes (Signed)
US 20+3w measurements c/w dates,efw 331g,bilat adnexa's wnl, post pl gr 0,cephalic,sdp of fluid 4.4cm, limited view out flow tracts,pt will come back for f/u us per Kim.,no obvious abnormalities seen,cx 3.6cm

## 2014-08-15 NOTE — Progress Notes (Signed)
Low-risk OB appointment G2P0101 8283w3d Estimated Date of Delivery: 12/30/14 BP 108/78 mmHg  Pulse 72  Wt 243 lb (110.224 kg)  LMP 03/25/2014  BP, weight, and urine reviewed.  Refer to obstetrical flow sheet for FH & FHR.  Reports good fm.  Denies regular uc's, lof, vb, or uti s/s. No complaints. Thinking about BTL, discussed r/b- will continue to think about it. Maybe interested in cb classes, info given. Discussed RLTCS vs. TOLAC, thinks she just wants c/s.  Reviewed today's anatomy u/s- female w/ limited views of nasal bone/nose/lips/OFTs- will recheck anatomy & efw in 4wks d/t h/o 30wk IUGR delivery last pregnancy. Plan:  Continue routine obstetrical care, will check gc/ct- no result for this preg F/U in 4wks for OB appointment and efw/afi/recheck anatomy u/s

## 2014-08-17 LAB — GC/CHLAMYDIA PROBE AMP
CHLAMYDIA, DNA PROBE: NEGATIVE
Neisseria gonorrhoeae by PCR: NEGATIVE

## 2014-09-08 ENCOUNTER — Encounter (HOSPITAL_COMMUNITY): Payer: Self-pay | Admitting: *Deleted

## 2014-09-08 ENCOUNTER — Inpatient Hospital Stay (HOSPITAL_COMMUNITY)
Admission: AD | Admit: 2014-09-08 | Discharge: 2014-09-08 | Disposition: A | Discharge status: Home / Self Care | Payer: BC Managed Care – PPO | Source: Ambulatory Visit | Attending: Obstetrics & Gynecology | Admitting: Obstetrics & Gynecology

## 2014-09-08 DIAGNOSIS — Z3A23 23 weeks gestation of pregnancy: Secondary | ICD-10-CM | POA: Insufficient documentation

## 2014-09-08 DIAGNOSIS — Z882 Allergy status to sulfonamides status: Secondary | ICD-10-CM | POA: Diagnosis not present

## 2014-09-08 DIAGNOSIS — R102 Pelvic and perineal pain: Secondary | ICD-10-CM | POA: Insufficient documentation

## 2014-09-08 DIAGNOSIS — Z8249 Family history of ischemic heart disease and other diseases of the circulatory system: Secondary | ICD-10-CM | POA: Diagnosis not present

## 2014-09-08 DIAGNOSIS — O9989 Other specified diseases and conditions complicating pregnancy, childbirth and the puerperium: Secondary | ICD-10-CM | POA: Insufficient documentation

## 2014-09-08 DIAGNOSIS — R109 Unspecified abdominal pain: Secondary | ICD-10-CM | POA: Diagnosis present

## 2014-09-08 LAB — URINALYSIS, ROUTINE W REFLEX MICROSCOPIC
Bilirubin Urine: NEGATIVE
Glucose, UA: NEGATIVE mg/dL
HGB URINE DIPSTICK: NEGATIVE
KETONES UR: NEGATIVE mg/dL
Leukocytes, UA: NEGATIVE
Nitrite: NEGATIVE
PH: 5 (ref 5.0–8.0)
Protein, ur: NEGATIVE mg/dL
UROBILINOGEN UA: 0.2 mg/dL (ref 0.0–1.0)

## 2014-09-08 NOTE — MAU Note (Signed)
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 Noticed a pain in lower left abdomen yesterday and last night noticed mild discomfort bilaterally in her lower abdomen. Pt. Feels she may have a UTI due to frequency and only passing small amounts of urine. Denies foul odor or burning with urination. Follow up ultrasound on Thursday to recheck chambers of the heart that were not visible and to recheck fetal weight per pt. Denies bleeding or LOF. Pt. Is feeling baby move well.

## 2014-09-08 NOTE — MAU Note (Signed)
Pt presents to MAU with complaints of urine urgency. Denies any vaginal bleeding

## 2014-09-08 NOTE — Discharge Instructions (Signed)

## 2014-09-08 NOTE — MAU Provider Note (Signed)
History   G2P0101 at 23.6 wks in with c/o abd pressure and bilat low abd pain  CSN: [id number]  Arrival date and time: 09/08/14 1244   First Provider Initiated Contact with Patient 09/08/14 1353      Chief Complaint  Patient presents with  . Urinary Tract Infection   HPI  OB History    Gravida Para Term Preterm AB TAB SAB Ectopic Multiple Living   2 1  1      1       Obstetric Comments   Decreased fetal movement and heart rate drops due to placental insuffiencey.       Past Medical History  Diagnosis Date  . Obesity   . Multiple allergies   . Plantar fasciitis, bilateral   . Contraceptive management 05/29/2013  . Supervision of normal pregnancy in first trimester 06/04/2014  . AMA (advanced maternal age) multigravida 35+ 06/04/2014    Past Surgical History  Procedure Laterality Date  . Carpel tunnel release  2005    bilateral  . Cesarean section  04/13/2012    Procedure: CESAREAN SECTION;  Surgeon: Tilda BurrowJohn V Ferguson, MD;  Location: [facility];  Service: Obstetrics;  Laterality: N/A;    Family History  Problem Relation Age of Onset  . Stroke Maternal Grandmother   . Heart disease Father   . Hypertension Mother   . Heart attack Paternal Grandfather   . Hypertension Paternal Grandmother   . Other Paternal Grandmother     eye issues  . Congestive Heart Failure Maternal Grandfather     History  Substance Use Topics  . Smoking status: Never Smoker   . Smokeless tobacco: Never Used  . Alcohol Use: No    Allergies:  Allergies  Allergen Reactions  . Bactrim [Sulfamethoxazole-Trimethoprim]   . Doxycycline     Prescriptions prior to admission  Medication Sig Dispense Refill Last Dose  . acetaminophen (TYLENOL) 500 MG tablet Take 1,000 mg by mouth every 6 (six) hours as needed for mild pain or headache.   09/08/2014 at Unknown time  . loratadine (CLARITIN) 10 MG tablet Take 10 mg by mouth daily.   09/07/2014 at Unknown time  . Prenatal Vit-Fe Fumarate-FA (PRENATAL  VITAMIN PO) Take 1 tablet by mouth daily.    09/07/2014 at Unknown time  . cromolyn (NASALCROM) 5.2 MG/ACT nasal spray Place 1 spray into both nostrils daily as needed.    prn    Review of Systems  Constitutional: Negative.   HENT: Negative.   Eyes: Negative.   Respiratory: Negative.   Cardiovascular: Negative.   Gastrointestinal: Positive for abdominal pain.  Genitourinary: Negative.   Musculoskeletal: Negative.   Skin: Negative.   Neurological: Negative.   Endo/Heme/Allergies: Negative.   Psychiatric/Behavioral: Negative.    Physical Exam   Blood pressure 143/87, pulse 90, temperature 98.2 F (36.8 C), resp. rate 18, height 5\' 5"  (1.651 m), weight 245 lb (111.131 kg), last menstrual period 03/25/2014.  Physical Exam  Constitutional: She is oriented to person, place, and time. She appears well-developed and well-nourished.  HENT:  Head: Normocephalic.  Eyes: Pupils are equal, round, and reactive to light.  Neck: Normal range of motion.  Cardiovascular: Normal rate, regular rhythm, normal heart sounds and intact distal pulses.   Respiratory: Effort normal and breath sounds normal.  GI: Soft. Bowel sounds are normal.  Genitourinary: Vagina normal and uterus normal.  Musculoskeletal: Normal range of motion.  Neurological: She is alert and oriented to person, place, and time. She has normal reflexes.  Skin: Skin is warm and dry.  Psychiatric: She has a normal mood and affect. Her behavior is normal. Judgment and thought content normal.    MAU Course  Procedures  MDM Round ligament pain  Assessment and Plan  FHR pattern reassurring, SVE firm/cl/th/high. D/c home  Wyvonnia Dusky DARLENE 09/08/2014, 1:54 PM

## 2014-09-12 ENCOUNTER — Ambulatory Visit (INDEPENDENT_AMBULATORY_CARE_PROVIDER_SITE_OTHER): Payer: BC Managed Care – PPO | Admitting: Obstetrics & Gynecology

## 2014-09-12 ENCOUNTER — Encounter: Payer: Self-pay | Admitting: Obstetrics & Gynecology

## 2014-09-12 ENCOUNTER — Ambulatory Visit (INDEPENDENT_AMBULATORY_CARE_PROVIDER_SITE_OTHER): Payer: BC Managed Care – PPO

## 2014-09-12 VITALS — BP 110/80 | HR 80 | Wt 247.0 lb

## 2014-09-12 DIAGNOSIS — Z3492 Encounter for supervision of normal pregnancy, unspecified, second trimester: Secondary | ICD-10-CM

## 2014-09-12 DIAGNOSIS — O09512 Supervision of elderly primigravida, second trimester: Secondary | ICD-10-CM | POA: Diagnosis not present

## 2014-09-12 DIAGNOSIS — Z331 Pregnant state, incidental: Secondary | ICD-10-CM

## 2014-09-12 DIAGNOSIS — Z1389 Encounter for screening for other disorder: Secondary | ICD-10-CM

## 2014-09-12 DIAGNOSIS — Z8759 Personal history of other complications of pregnancy, childbirth and the puerperium: Secondary | ICD-10-CM

## 2014-09-12 DIAGNOSIS — Z363 Encounter for antenatal screening for malformations: Secondary | ICD-10-CM

## 2014-09-12 LAB — POCT URINALYSIS DIPSTICK
Blood, UA: NEGATIVE
Glucose, UA: NEGATIVE
Ketones, UA: NEGATIVE
Leukocytes, UA: NEGATIVE
NITRITE UA: NEGATIVE
PROTEIN UA: NEGATIVE

## 2014-09-12 NOTE — Progress Notes (Signed)
Sonogram revelas normal growth, echogenic foci in the left ventricle, isolated finding  G2P0101 7454w3d Estimated Date of Delivery: 12/30/14  Blood pressure 110/80, pulse 80, weight 247 lb (112.038 kg), last menstrual period 03/25/2014.   BP weight and urine results all reviewed and noted.  Please refer to the obstetrical flow sheet for the fundal height and fetal heart rate documentation:  Patient reports good fetal movement, denies any bleeding and no rupture of membranes symptoms or regular contractions. Patient is without complaints. All questions were answered.  Plan:  Continued routine obstetrical care,   Follow up in 4 weeks for OB appointment, sonogram PN2

## 2014-09-12 NOTE — Progress Notes (Signed)
US 24+3WKS,cephalic,efw 729g 51.8%,post pl gr 0,bilat adnexa's wnl,sdp of fluid 5.5cm, .26cm echogenic foci in lt ventricle of the heart,fht 145bpm,pictures of the nose and lip were visualized on today us w/no obvious abn.

## 2014-09-17 ENCOUNTER — Encounter: Payer: Self-pay | Admitting: Women's Health

## 2014-09-17 DIAGNOSIS — O358XX0 Maternal care for other (suspected) fetal abnormality and damage, not applicable or unspecified: Secondary | ICD-10-CM | POA: Insufficient documentation

## 2014-09-17 DIAGNOSIS — O35BXX0 Maternal care for other (suspected) fetal abnormality and damage, fetal cardiac anomalies, not applicable or unspecified: Secondary | ICD-10-CM | POA: Insufficient documentation

## 2014-09-17 HISTORY — DX: Maternal care for other (suspected) fetal abnormality and damage, fetal cardiac anomalies, not applicable or unspecified: O35.BXX0

## 2014-10-10 ENCOUNTER — Other Ambulatory Visit: Payer: BC Managed Care – PPO

## 2014-10-10 ENCOUNTER — Encounter: Payer: Self-pay | Admitting: Obstetrics & Gynecology

## 2014-10-10 ENCOUNTER — Ambulatory Visit (INDEPENDENT_AMBULATORY_CARE_PROVIDER_SITE_OTHER): Payer: BC Managed Care – PPO | Admitting: Obstetrics & Gynecology

## 2014-10-10 ENCOUNTER — Ambulatory Visit (INDEPENDENT_AMBULATORY_CARE_PROVIDER_SITE_OTHER): Payer: BC Managed Care – PPO

## 2014-10-10 VITALS — BP 110/70 | HR 76 | Wt 249.4 lb

## 2014-10-10 DIAGNOSIS — Z3A28 28 weeks gestation of pregnancy: Secondary | ICD-10-CM | POA: Diagnosis not present

## 2014-10-10 DIAGNOSIS — Z331 Pregnant state, incidental: Secondary | ICD-10-CM

## 2014-10-10 DIAGNOSIS — Z8742 Personal history of other diseases of the female genital tract: Secondary | ICD-10-CM

## 2014-10-10 DIAGNOSIS — Z131 Encounter for screening for diabetes mellitus: Secondary | ICD-10-CM

## 2014-10-10 DIAGNOSIS — O09523 Supervision of elderly multigravida, third trimester: Secondary | ICD-10-CM

## 2014-10-10 DIAGNOSIS — Z8759 Personal history of other complications of pregnancy, childbirth and the puerperium: Secondary | ICD-10-CM

## 2014-10-10 DIAGNOSIS — Z1389 Encounter for screening for other disorder: Secondary | ICD-10-CM

## 2014-10-10 DIAGNOSIS — Z3493 Encounter for supervision of normal pregnancy, unspecified, third trimester: Secondary | ICD-10-CM

## 2014-10-10 DIAGNOSIS — Z369 Encounter for antenatal screening, unspecified: Secondary | ICD-10-CM

## 2014-10-10 LAB — POCT URINALYSIS DIPSTICK
Glucose, UA: NEGATIVE
KETONES UA: NEGATIVE
Leukocytes, UA: NEGATIVE
Nitrite, UA: NEGATIVE
Protein, UA: NEGATIVE
RBC UA: NEGATIVE

## 2014-10-10 NOTE — Progress Notes (Signed)
[id number] [redacted]w[redacted]d Estimated Date of Delivery: 12/30/14  Blood pressure 110/70, pulse 76, weight 249 lb 6.4 oz (113.127 kg), last menstrual period 03/25/2014.   BP weight and urine results all reviewed and noted.  Please refer to the obstetrical flow sheet for the fundal height and fetal heart rate documentation:  Patient reports good fetal movement, denies any bleeding and no rupture of membranes symptoms or regular contractions. Patient is without complaints. All questions were answered.  Plan:  Continued routine obstetrical care,   Follow up in 3 weeks for OB appointment,   Sonogram with excellent interval growth fluid and normal anatomy

## 2014-10-10 NOTE — Progress Notes (Signed)
[place] 28+3wks measurements c/w dates, efw 1321g 60.6%,post pl gr 0,bilat adnexa's wnl,afi 13.2cm,fht 141bpm

## 2014-10-11 LAB — CBC
HEMOGLOBIN: 12.3 g/dL (ref 11.1–15.9)
Hematocrit: 36.5 % (ref 34.0–46.6)
MCH: 30.5 pg (ref 26.6–33.0)
MCHC: 33.7 g/dL (ref 31.5–35.7)
MCV: 91 fL (ref 79–97)
Platelets: 199 10*3/uL (ref 150–379)
RBC: 4.03 x10E6/uL (ref 3.77–5.28)
RDW: 13.1 % (ref 12.3–15.4)
WBC: 7.3 10*3/uL (ref 3.4–10.8)

## 2014-10-11 LAB — ANTIBODY SCREEN: ANTIBODY SCREEN: NEGATIVE

## 2014-10-11 LAB — GLUCOSE TOLERANCE, 2 HOURS W/ 1HR
GLUCOSE, 1 HOUR: 99 mg/dL (ref 65–179)
GLUCOSE, FASTING: 79 mg/dL (ref 65–91)
Glucose, 2 hour: 105 mg/dL (ref 65–152)

## 2014-10-11 LAB — HSV 2 ANTIBODY, IGG

## 2014-10-11 LAB — HIV ANTIBODY (ROUTINE TESTING W REFLEX): HIV Screen 4th Generation wRfx: NONREACTIVE

## 2014-10-11 LAB — RPR: RPR: NONREACTIVE

## 2014-11-01 ENCOUNTER — Encounter: Payer: Self-pay | Admitting: Obstetrics and Gynecology

## 2014-11-01 ENCOUNTER — Ambulatory Visit (INDEPENDENT_AMBULATORY_CARE_PROVIDER_SITE_OTHER): Payer: BC Managed Care – PPO | Admitting: Obstetrics and Gynecology

## 2014-11-01 VITALS — BP 100/60 | HR 100 | Wt 251.0 lb

## 2014-11-01 DIAGNOSIS — Z1389 Encounter for screening for other disorder: Secondary | ICD-10-CM

## 2014-11-01 DIAGNOSIS — Z8759 Personal history of other complications of pregnancy, childbirth and the puerperium: Secondary | ICD-10-CM

## 2014-11-01 DIAGNOSIS — Z3492 Encounter for supervision of normal pregnancy, unspecified, second trimester: Secondary | ICD-10-CM

## 2014-11-01 DIAGNOSIS — Z331 Pregnant state, incidental: Secondary | ICD-10-CM

## 2014-11-01 LAB — POCT URINALYSIS DIPSTICK
Blood, UA: NEGATIVE
Glucose, UA: NEGATIVE
Ketones, UA: NEGATIVE
LEUKOCYTES UA: NEGATIVE
Nitrite, UA: NEGATIVE
Protein, UA: NEGATIVE

## 2014-11-01 NOTE — Progress Notes (Signed)
Pt denies any problems or concerns at this time.  

## 2014-11-01 NOTE — Progress Notes (Signed)
Patient ID: Jamie Wang, female   DOB: 12/18/1975, 39 y.o.   MRN: [id number]  [id number] 6828w4d Estimated Date of Delivery: 12/30/14  Blood pressure 100/60, pulse 100, weight 251 lb (113.853 kg), last menstrual period 03/25/2014.   refer to the ob flow sheet for FH and FHR, also BP, Wt, Urine results:notable for negative  Patient reports  + good fetal movement, denies any bleeding and no rupture of membranes symptoms or regular contractions. Patient complaints: Pt has no complaints at this time. Her last pregnancy was complicated by IUGR newborn. Pt is planning on Cesarean Section at 39 weeks.   FH - 36 cm FHR- 143-146 bpm  Questions were answered. Assessment: 328w4d; G2P0101. AMA, Hx IUGR infant c last preg  Plan:  Continued routine obstetrical care. Continue to monitor regularly with US. Will plan on NST for last 3-4 weeks of pregnancy. Cesarean Section at 39 weeks including a wide-excision over the abdomen. Pt is planning on tubal ligation.  F/u in 2 weeks for routine pre-natal care.    This chart was scribed for Tilda BurrowJohn Wiletta Bermingham V, MD by Gwenyth Oberatherine Macek, Medical Scribe. This patient was seen in room 3 and the patient's care was started at 10:09 AM.   `Attestation of Attending Supervision of Advanced Practitioner: Evaluation and management procedures were performed by the PA/NP/CNM/OB Fellow under my supervision/collaboration. Chart reviewed and agree with management and plan.  Aldyn Toon V 11/01/2014 10:19 AM    s

## 2014-11-14 ENCOUNTER — Ambulatory Visit (INDEPENDENT_AMBULATORY_CARE_PROVIDER_SITE_OTHER): Payer: BC Managed Care – PPO

## 2014-11-14 ENCOUNTER — Encounter: Payer: Self-pay | Admitting: Obstetrics & Gynecology

## 2014-11-14 ENCOUNTER — Ambulatory Visit (INDEPENDENT_AMBULATORY_CARE_PROVIDER_SITE_OTHER): Payer: BC Managed Care – PPO | Admitting: Obstetrics & Gynecology

## 2014-11-14 VITALS — BP 102/60 | HR 80 | Wt 253.0 lb

## 2014-11-14 DIAGNOSIS — Z331 Pregnant state, incidental: Secondary | ICD-10-CM

## 2014-11-14 DIAGNOSIS — Z1389 Encounter for screening for other disorder: Secondary | ICD-10-CM

## 2014-11-14 DIAGNOSIS — Z8759 Personal history of other complications of pregnancy, childbirth and the puerperium: Secondary | ICD-10-CM

## 2014-11-14 DIAGNOSIS — Z8742 Personal history of other diseases of the female genital tract: Secondary | ICD-10-CM

## 2014-11-14 DIAGNOSIS — Z3493 Encounter for supervision of normal pregnancy, unspecified, third trimester: Secondary | ICD-10-CM

## 2014-11-14 LAB — POCT URINALYSIS DIPSTICK
Blood, UA: NEGATIVE
Glucose, UA: NEGATIVE
Ketones, UA: NEGATIVE
Leukocytes, UA: NEGATIVE
NITRITE UA: NEGATIVE
PROTEIN UA: NEGATIVE

## 2014-11-14 NOTE — Progress Notes (Signed)
[id number] [redacted]w[redacted]d Estimated Date of Delivery: 12/30/14  Blood pressure 102/60, pulse 80, weight 253 lb (114.76 kg), last menstrual period 03/25/2014.   BP weight and urine results all reviewed and noted.  Please refer to the obstetrical flow sheet for the fundal height and fetal heart rate documentation:  Patient reports good fetal movement, denies any bleeding and no rupture of membranes symptoms or regular contractions. Patient is without complaints. All questions were answered.  Plan:  Continued routine obstetrical care,   Follow up in 2 weeks for OB appointment,   Sonogram is normal with excellent fetal growth

## 2014-11-14 NOTE — Progress Notes (Signed)
Growth [place] today at 33+[redacted] weeks GA. Single, active female fetus in a cephalic presentation. FHR 137bpm. Posterior Gr1 placenta. Fluid is normal with an AFI of 15.7 cm; SVP 6.22 cm. EFW today 2359 g (57%) which is consistent with dating.

## 2014-11-15 ENCOUNTER — Encounter: Payer: BC Managed Care – PPO | Admitting: Obstetrics and Gynecology

## 2014-11-15 ENCOUNTER — Other Ambulatory Visit: Payer: BC Managed Care – PPO

## 2014-11-25 ENCOUNTER — Ambulatory Visit (INDEPENDENT_AMBULATORY_CARE_PROVIDER_SITE_OTHER): Payer: BC Managed Care – PPO | Admitting: Obstetrics and Gynecology

## 2014-11-25 ENCOUNTER — Encounter: Payer: Self-pay | Admitting: Obstetrics and Gynecology

## 2014-11-25 VITALS — BP 124/70 | HR 104 | Temp 98.5°F | Wt 252.5 lb

## 2014-11-25 DIAGNOSIS — Z1389 Encounter for screening for other disorder: Secondary | ICD-10-CM

## 2014-11-25 DIAGNOSIS — Z3493 Encounter for supervision of normal pregnancy, unspecified, third trimester: Secondary | ICD-10-CM

## 2014-11-25 DIAGNOSIS — Z331 Pregnant state, incidental: Secondary | ICD-10-CM

## 2014-11-25 LAB — POCT URINALYSIS DIPSTICK
Glucose, UA: NEGATIVE
Ketones, UA: NEGATIVE
Leukocytes, UA: NEGATIVE
Nitrite, UA: NEGATIVE
Protein, UA: NEGATIVE

## 2014-11-25 MED ORDER — HYDROCODONE-HOMATROPINE 5-1.5 MG/5ML PO SYRP: 5.0000 mL | ORAL_SOLUTION | Freq: Four times a day (QID) | ORAL | Status: DC | PRN

## 2014-11-25 NOTE — Progress Notes (Signed)
  workin appt for [employer] and cough. Also to post case for rpt c/s and btl, wide excision of cicatrix  G2P0101 [redacted]w[redacted]d Estimated Date of Delive9/12/16  Blood pressure 124/70, pulse 104, temperature 98.5 F (36.9 C), weight 252 lb 8 oz (114.533 kg), last menstrual period 03/25/2014.   refer to the ob flow sheet for FH and FHR, also BP, Wt, Urine results:notable for negatieve  Patient reports  Cough x 1wk, improving, good fetal movement, denies any bleeding and no rupture of membranes symptoms or regular contractions. Patient complaints:uri sx. pex tonsils fine, tms fine , dry nonproductive cough Questions were answered. Assessment:  Plan:  Continued routine obstetrical care, needs antitussive  F/u in 2 weeks forpnx  Rx hycodan

## 2014-11-29 ENCOUNTER — Encounter: Payer: Self-pay | Admitting: Obstetrics & Gynecology

## 2014-11-29 ENCOUNTER — Ambulatory Visit (INDEPENDENT_AMBULATORY_CARE_PROVIDER_SITE_OTHER): Payer: BC Managed Care – PPO | Admitting: Obstetrics & Gynecology

## 2014-11-29 VITALS — BP 104/60 | HR 80 | Wt 250.0 lb

## 2014-11-29 DIAGNOSIS — Z3493 Encounter for supervision of normal pregnancy, unspecified, third trimester: Secondary | ICD-10-CM

## 2014-11-29 DIAGNOSIS — Z331 Pregnant state, incidental: Secondary | ICD-10-CM

## 2014-11-29 DIAGNOSIS — Z8742 Personal history of other diseases of the female genital tract: Secondary | ICD-10-CM

## 2014-11-29 DIAGNOSIS — O34219 Maternal care for unspecified type scar from previous cesarean delivery: Secondary | ICD-10-CM

## 2014-11-29 DIAGNOSIS — Z8759 Personal history of other complications of pregnancy, childbirth and the puerperium: Secondary | ICD-10-CM

## 2014-11-29 DIAGNOSIS — O3421 Maternal care for scar from previous cesarean delivery: Secondary | ICD-10-CM

## 2014-11-29 DIAGNOSIS — Z1389 Encounter for screening for other disorder: Secondary | ICD-10-CM

## 2014-11-29 LAB — POCT URINALYSIS DIPSTICK
Blood, UA: NEGATIVE
GLUCOSE UA: NEGATIVE
KETONES UA: NEGATIVE
Nitrite, UA: NEGATIVE
Protein, UA: NEGATIVE

## 2014-11-29 NOTE — Progress Notes (Signed)
[id number] [redacted]w[redacted]d Estimated Date of Delivery: 12/30/14  Blood pressure 104/60, pulse 80, weight 250 lb (113.399 kg), last menstrual period 03/25/2014.   BP weight and urine results all reviewed and noted.  Please refer to the obstetrical flow sheet for the fundal height and fetal heart rate documentation:  Patient reports good fetal movement, denies any bleeding and no rupture of membranes symptoms or regular contractions. Patient is without complaints. All questions were answered.  Plan:  Continued routine obstetrical care,   Follow up in 1 weeks for OB appointment,   Dr Emelda Fear to schedule her repeat section next week

## 2014-12-09 ENCOUNTER — Ambulatory Visit (INDEPENDENT_AMBULATORY_CARE_PROVIDER_SITE_OTHER): Payer: BC Managed Care – PPO

## 2014-12-09 ENCOUNTER — Ambulatory Visit (INDEPENDENT_AMBULATORY_CARE_PROVIDER_SITE_OTHER): Payer: BC Managed Care – PPO | Admitting: Obstetrics and Gynecology

## 2014-12-09 ENCOUNTER — Encounter: Payer: Self-pay | Admitting: Obstetrics and Gynecology

## 2014-12-09 ENCOUNTER — Other Ambulatory Visit: Payer: Self-pay | Admitting: Obstetrics and Gynecology

## 2014-12-09 VITALS — BP 100/60 | HR 96 | Wt 253.0 lb

## 2014-12-09 DIAGNOSIS — Z331 Pregnant state, incidental: Secondary | ICD-10-CM

## 2014-12-09 DIAGNOSIS — O09522 Supervision of elderly multigravida, second trimester: Secondary | ICD-10-CM

## 2014-12-09 DIAGNOSIS — Z3493 Encounter for supervision of normal pregnancy, unspecified, third trimester: Secondary | ICD-10-CM

## 2014-12-09 DIAGNOSIS — Z1389 Encounter for screening for other disorder: Secondary | ICD-10-CM

## 2014-12-09 NOTE — Progress Notes (Signed)
Pt denies any problems or concerns at this time.  

## 2014-12-09 NOTE — Progress Notes (Signed)
[place] 37 wks,efw 3505g 81.3%,cephalic,post pl 1,afi 11cm,fht 155bpm

## 2014-12-09 NOTE — Progress Notes (Signed)
Patient ID: Jamie Wang, female   DOB: 1975-06-15, 39 y.o.   MRN: [id number]  [id number] Estimated Date of Delivery: 12/30/14  Blood pressure 100/60, pulse 96, weight 253 lb (114.76 kg), last menstrual period 03/25/2014.   refer to the ob flow sheet for FH and FHR, also BP, Wt, Urine results:notable for neg protein  Patient reports  + good fetal movement, denies any bleeding and no rupture of membranes symptoms or regular contractions. Patient complaints:None.  FHR: 155 bpm FH: u+22 cm  Questions were answered. Assessment: Pregnancy [redacted]w[redacted]d, LROB prior cesarean for repeat cesarean, tubal ligation, and wide excision of cicatrix on 12/24/2014 @ 2 pm    Plan:  Continued routine obstetrical care, patient scheduled for C-section  F/u in 1 weeks for pnx care, then in 2 weeks for scheduled C-section on 12/24/14    This chart was SCRIBED for Christin Bach, MD by Ronney Lion, ED Scribe. This patient was seen in room I personally performed the services described in this documentation, which was SCRIBED in my presence. The recorded information has been reviewed and considered accurate. It has been edited as necessary during review. Tilda Burrow, MD  and the patient's care was started at 3:06 PM.   I personally performed the services described in this documentation, which was SCRIBED in my presence. The recorded information has been reviewed and considered accurate. It has been edited as necessary during review. Tilda Burrow, MD

## 2014-12-16 ENCOUNTER — Encounter: Payer: Self-pay | Admitting: Obstetrics and Gynecology

## 2014-12-16 ENCOUNTER — Ambulatory Visit (INDEPENDENT_AMBULATORY_CARE_PROVIDER_SITE_OTHER): Payer: BC Managed Care – PPO | Admitting: Obstetrics and Gynecology

## 2014-12-16 VITALS — BP 110/66 | HR 112 | Wt 255.0 lb

## 2014-12-16 DIAGNOSIS — Z1389 Encounter for screening for other disorder: Secondary | ICD-10-CM

## 2014-12-16 DIAGNOSIS — Z3493 Encounter for supervision of normal pregnancy, unspecified, third trimester: Secondary | ICD-10-CM

## 2014-12-16 DIAGNOSIS — Z331 Pregnant state, incidental: Secondary | ICD-10-CM

## 2014-12-16 LAB — POCT URINALYSIS DIPSTICK
Blood, UA: NEGATIVE
Blood, UA: NEGATIVE
GLUCOSE UA: NEGATIVE
Glucose, UA: NEGATIVE
KETONES UA: NEGATIVE
Ketones, UA: NEGATIVE
Leukocytes, UA: NEGATIVE
Leukocytes, UA: NEGATIVE
Nitrite, UA: NEGATIVE
Nitrite, UA: NEGATIVE
PROTEIN UA: NEGATIVE
PROTEIN UA: NEGATIVE

## 2014-12-16 NOTE — Progress Notes (Signed)
Patient ID: Jamie Wang, female   DOB: 1975/09/16, 39 y.o.   MRN: [id number] G2P0101 [redacted]w[redacted]d Estimated Date of Delivery: 12/30/14  Blood pressure 110/66, pulse 112, weight 255 lb (115.667 kg), last menstrual period 03/25/2014.   refer to the ob flow sheet for FH and FHR, also BP, Wt, Urine results: NEG    Patient reports  + good fetal movement, denies any bleeding and no rupture of membranes symptoms or regular contractions. FHR: 143 FH: 42cm  Patient complaints: No current complaints.  Questions were answered. Assessment:  Plan:   1. Continued routine obstetrical care 2. Labs to be completed this Friday 3. Post op visit 2 weeks after delivery  4. Circumcision 1 week after birth     This chart was SCRIBED for Christin Bach, MD by Marica Otter, ED Scribe. This patient was seen in room 1, and the patient's care was started at 4:01 PM.  I personally performed the services described in this documentation, which was SCRIBED in my presence. The recorded information has been reviewed and considered accurate. It has been edited as necessary during review. Tilda Burrow, MD

## 2014-12-16 NOTE — Progress Notes (Signed)
Pt denies any problems or concerns at this time.  

## 2014-12-20 ENCOUNTER — Encounter (HOSPITAL_COMMUNITY)
Admission: RE | Admit: 2014-12-20 | Discharge: 2014-12-20 | Disposition: A | Discharge status: Home / Self Care | Payer: BC Managed Care – PPO | Source: Ambulatory Visit | Attending: Obstetrics and Gynecology | Admitting: Obstetrics and Gynecology

## 2014-12-20 ENCOUNTER — Encounter (HOSPITAL_COMMUNITY): Payer: Self-pay

## 2014-12-20 ENCOUNTER — Other Ambulatory Visit: Payer: Self-pay | Admitting: Obstetrics and Gynecology

## 2014-12-20 DIAGNOSIS — Z01818 Encounter for other preprocedural examination: Secondary | ICD-10-CM | POA: Diagnosis present

## 2014-12-20 HISTORY — DX: Gastro-esophageal reflux disease without esophagitis: K21.9

## 2014-12-20 HISTORY — DX: Anxiety disorder, unspecified: F41.9

## 2014-12-20 LAB — URINALYSIS, ROUTINE W REFLEX MICROSCOPIC
Bilirubin Urine: NEGATIVE
Glucose, UA: NEGATIVE mg/dL
Hgb urine dipstick: NEGATIVE
KETONES UR: NEGATIVE mg/dL
NITRITE: NEGATIVE
Protein, ur: NEGATIVE mg/dL
Specific Gravity, Urine: 1.015 (ref 1.005–1.030)
UROBILINOGEN UA: 0.2 mg/dL (ref 0.0–1.0)
pH: 7 (ref 5.0–8.0)

## 2014-12-20 LAB — URINE MICROSCOPIC-ADD ON

## 2014-12-20 LAB — TYPE AND SCREEN
ABO/RH(D): A POS
Antibody Screen: NEGATIVE

## 2014-12-20 LAB — CBC
HEMATOCRIT: 35.3 % — AB (ref 36.0–46.0)
Hemoglobin: 11.9 g/dL — ABNORMAL LOW (ref 12.0–15.0)
MCH: 30.1 pg (ref 26.0–34.0)
MCHC: 33.7 g/dL (ref 30.0–36.0)
MCV: 89.1 fL (ref 78.0–100.0)
Platelets: 196 10*3/uL (ref 150–400)
RBC: 3.96 MIL/uL (ref 3.87–5.11)
RDW: 13.7 % (ref 11.5–15.5)
WBC: 8.4 10*3/uL (ref 4.0–10.5)

## 2014-12-20 NOTE — Patient Instructions (Signed)
Your procedure is scheduled on:  December 24, 2014  Enter through the Main Entrance of [facility] at: 7:15 am   Pick up the phone at the desk and dial [phone].  Call this number if you have problems the morning of surgery: [phone].  Remember: Do NOT eat food: after midnight on Monday  Do NOT drink clear liquids after:  Midnight on Monday  Take these medicines the morning of surgery with a SIP OF WATER:  Zantac   Do NOT wear jewelry (body piercing), metal hair clips/bobby pins, or nail polish. Do NOT wear lotions, powders, or perfumes.  You may wear deoderant. Do NOT shave for 48 hours prior to surgery. Do NOT bring valuables to the hospital. Leave suitcase in car.  After surgery it may be brought to your room.  For patients admitted to the hospital, checkout time is 11:00 AM the day of discharge.

## 2014-12-21 LAB — RPR: RPR Ser Ql: NONREACTIVE

## 2014-12-23 NOTE — H&P (Signed)
Jamie Wang is a 39 y.o. female G2P0101 [redacted]w[redacted]d admitted for repeat cesarean  Section, bilateral tubal ligation by filshie clip placement, and wide excision of prior cicatrix.  prenatal course has been thru [facility], and notable for normal fetal growth, as well as Left Ventricle EICF that remained small, with otherwise normal anatomic surveys. She chose not to pursue genetic screening.Marland Kitchen History OB History    Gravida Para Term Preterm AB TAB SAB Ectopic Multiple Living   Obstetric Comments   Decreased fetal movement and heart rate drops due to placental insuffiencey.     prior cesarean done for decreased FM and BPP 2/8 , taken promptly to cesarean, delivering a SGA infant that has done well. Past Medical History  Diagnosis Date  . Obesity   . Multiple allergies   . Plantar fasciitis, bilateral   . Contraceptive management 05/29/2013  . Supervision of normal pregnancy in first trimester 06/04/2014  . AMA (advanced maternal age) multigravida 35+ 06/04/2014  . Anxiety   . GERD (gastroesophageal reflux disease)     takes zantac    Past Surgical History  Procedure Laterality Date  . Carpel tunnel release  2005    bilateral  . Cesarean section  04/13/2012    Procedure: CESAREAN SECTION;  Surgeon: Tilda Burrow, MD;  Location: [facility];  Service: Obstetrics;  Laterality: N/A;  . Wisdom tooth extraction     Family History: family history includes Congestive Heart Failure in her maternal grandfather; Heart attack in her paternal grandfather; Heart disease in her father; Hypertension in her mother and paternal grandmother; Other in her paternal grandmother; Stroke in her maternal grandmother. Social History:  reports that she has never smoked. She has never used smokeless tobacco. She reports that she does not drink alcohol or use illicit drugs.   Prenatal Transfer Tool  Maternal Diabetes: No Genetic Screening: Declined Maternal Ultrasounds/Referrals: Abnormal:   Findings:   Isolated EIF (echogenic intracardiac focus) Fetal Ultrasounds or other Referrals:  None Maternal Substance Abuse:  No Significant Maternal Medications:  None Significant Maternal Lab Results:   Other Comments:  None  ROS negative    Last menstrual period 03/25/2014. Exam Physical Exam  Constitutional: She is oriented to person, place, and time. She appears well-developed and well-nourished.  HENT:  Head: Normocephalic and atraumatic.  Eyes: Pupils are equal, round, and reactive to light.  Neck: Normal range of motion. Neck supple. No thyromegaly present.  Cardiovascular: Normal rate and regular rhythm.   Respiratory: Effort normal.  GI: Soft.  Gravid uterus c/w dates, with lower abdominal laxity and edema with peau d'orange noted slightly above the prior cesarean line, with abdominal skin stretching.  Genitourinary: Vagina normal.  Musculoskeletal: Normal range of motion.  Neurological: She is alert and oriented to person, place, and time.  Skin: Skin is warm and dry.  See abd exam  Psychiatric: She has a normal mood and affect. Her behavior is normal. Judgment and thought content normal.    Prenatal labs: ABO, Rh: --/--/A POS (09/02 0950) Antibody: NEG (09/02 0950) Rubella:   RPR: Non Reactive (09/02 0950)  HBsAg: Negative (02/16 1641)  HIV:    GBS:   not in record  Assessment/Plan: Pregnancy 39wk, prior cesarean not for TOLAC, repeat cesarean section, elective sterilization(filshie clips), and wide excision of prior cicatrix. Risks of procedure including tissue healing , infection, bleeding requiring transfusion, or injury to other body  parts reviewed with patient at last visits.    Ares Cardozo V 12/23/2014, 9:43 PM

## 2014-12-24 ENCOUNTER — Inpatient Hospital Stay (HOSPITAL_COMMUNITY): Payer: BC Managed Care – PPO | Admitting: Anesthesiology

## 2014-12-24 ENCOUNTER — Inpatient Hospital Stay (HOSPITAL_COMMUNITY)
Admission: RE | Admit: 2014-12-24 | Discharge: 2014-12-26 | DRG: 765 | Disposition: A | Discharge status: Home / Self Care | Payer: BC Managed Care – PPO | Source: Ambulatory Visit | Attending: Obstetrics and Gynecology | Admitting: Obstetrics and Gynecology

## 2014-12-24 ENCOUNTER — Encounter (HOSPITAL_COMMUNITY)
Admission: RE | Disposition: A | Discharge status: Home / Self Care | Payer: Self-pay | Source: Ambulatory Visit | Attending: Obstetrics and Gynecology

## 2014-12-24 ENCOUNTER — Encounter (HOSPITAL_COMMUNITY): Payer: Self-pay | Admitting: Anesthesiology

## 2014-12-24 DIAGNOSIS — O99214 Obesity complicating childbirth: Secondary | ICD-10-CM | POA: Diagnosis present

## 2014-12-24 DIAGNOSIS — Z3A39 39 weeks gestation of pregnancy: Secondary | ICD-10-CM | POA: Diagnosis present

## 2014-12-24 DIAGNOSIS — O3421 Maternal care for scar from previous cesarean delivery: Secondary | ICD-10-CM | POA: Diagnosis present

## 2014-12-24 DIAGNOSIS — Z8759 Personal history of other complications of pregnancy, childbirth and the puerperium: Secondary | ICD-10-CM

## 2014-12-24 DIAGNOSIS — O35BXX0 Maternal care for other (suspected) fetal abnormality and damage, fetal cardiac anomalies, not applicable or unspecified: Secondary | ICD-10-CM | POA: Diagnosis present

## 2014-12-24 DIAGNOSIS — O09523 Supervision of elderly multigravida, third trimester: Secondary | ICD-10-CM | POA: Diagnosis not present

## 2014-12-24 DIAGNOSIS — O9962 Diseases of the digestive system complicating childbirth: Secondary | ICD-10-CM | POA: Diagnosis present

## 2014-12-24 DIAGNOSIS — O9902 Anemia complicating childbirth: Secondary | ICD-10-CM | POA: Diagnosis present

## 2014-12-24 DIAGNOSIS — O99344 Other mental disorders complicating childbirth: Secondary | ICD-10-CM | POA: Diagnosis present

## 2014-12-24 DIAGNOSIS — Z881 Allergy status to other antibiotic agents status: Secondary | ICD-10-CM

## 2014-12-24 DIAGNOSIS — Z302 Encounter for sterilization: Secondary | ICD-10-CM

## 2014-12-24 DIAGNOSIS — K219 Gastro-esophageal reflux disease without esophagitis: Secondary | ICD-10-CM | POA: Diagnosis present

## 2014-12-24 DIAGNOSIS — F419 Anxiety disorder, unspecified: Secondary | ICD-10-CM | POA: Diagnosis present

## 2014-12-24 DIAGNOSIS — O358XX0 Maternal care for other (suspected) fetal abnormality and damage, not applicable or unspecified: Secondary | ICD-10-CM | POA: Diagnosis present

## 2014-12-24 DIAGNOSIS — Z98891 History of uterine scar from previous surgery: Secondary | ICD-10-CM

## 2014-12-24 DIAGNOSIS — O09529 Supervision of elderly multigravida, unspecified trimester: Secondary | ICD-10-CM

## 2014-12-24 DIAGNOSIS — Z6841 Body Mass Index (BMI) 40.0 and over, adult: Secondary | ICD-10-CM

## 2014-12-24 DIAGNOSIS — O4292 Full-term premature rupture of membranes, unspecified as to length of time between rupture and onset of labor: Secondary | ICD-10-CM | POA: Diagnosis present

## 2014-12-24 DIAGNOSIS — M722 Plantar fascial fibromatosis: Secondary | ICD-10-CM | POA: Diagnosis present

## 2014-12-24 DIAGNOSIS — O34219 Maternal care for unspecified type scar from previous cesarean delivery: Secondary | ICD-10-CM | POA: Diagnosis present

## 2014-12-24 LAB — PREPARE RBC (CROSSMATCH)

## 2014-12-24 SURGERY — Surgical Case
Anesthesia: Spinal | Site: Abdomen | Laterality: Bilateral

## 2014-12-24 MED ORDER — NALBUPHINE HCL 10 MG/ML IJ SOLN: 5.0000 mg | Freq: Once | INTRAMUSCULAR | Status: DC | PRN

## 2014-12-24 MED ORDER — NALBUPHINE HCL 10 MG/ML IJ SOLN: 5.0000 mg | INTRAMUSCULAR | Status: DC | PRN

## 2014-12-24 MED ORDER — MORPHINE SULFATE 0.5 MG/ML IJ SOLN
INTRAMUSCULAR | Status: AC
  Filled 2014-12-24: qty 100

## 2014-12-24 MED ORDER — DIPHENHYDRAMINE HCL 50 MG/ML IJ SOLN: 12.5000 mg | INTRAMUSCULAR | Status: DC | PRN

## 2014-12-24 MED ORDER — NALOXONE HCL 0.4 MG/ML IJ SOLN: 0.4000 mg | INTRAMUSCULAR | Status: DC | PRN

## 2014-12-24 MED ORDER — ONDANSETRON HCL 4 MG/2ML IJ SOLN: 4.0000 mg | Freq: Three times a day (TID) | INTRAMUSCULAR | Status: DC | PRN

## 2014-12-24 MED ORDER — LACTATED RINGERS IV SOLN
INTRAVENOUS | Status: DC
  Administered 2014-12-24 (×3): via INTRAVENOUS

## 2014-12-24 MED ORDER — FENTANYL CITRATE (PF) 100 MCG/2ML IJ SOLN
INTRAMUSCULAR | Status: DC | PRN
  Administered 2014-12-24: 15 ug via INTRATHECAL

## 2014-12-24 MED ORDER — FENTANYL CITRATE (PF) 100 MCG/2ML IJ SOLN: 25.0000 ug | INTRAMUSCULAR | Status: DC | PRN

## 2014-12-24 MED ORDER — PHENYLEPHRINE HCL 10 MG/ML IJ SOLN
INTRAMUSCULAR | Status: DC | PRN
  Administered 2014-12-24: 40 ug via INTRAVENOUS

## 2014-12-24 MED ORDER — BUPIVACAINE IN DEXTROSE 0.75-8.25 % IT SOLN
INTRATHECAL | Status: DC | PRN
  Administered 2014-12-24: 1.6 mg via INTRATHECAL

## 2014-12-24 MED ORDER — PHENYLEPHRINE 8 MG IN D5W 100 ML (0.08MG/ML) PREMIX OPTIME
INJECTION | INTRAVENOUS | Status: AC
  Filled 2014-12-24: qty 100

## 2014-12-24 MED ORDER — SODIUM CHLORIDE 0.9 % IJ SOLN: 3.0000 mL | INTRAMUSCULAR | Status: DC | PRN

## 2014-12-24 MED ORDER — METOCLOPRAMIDE HCL 5 MG/ML IJ SOLN: 10.0000 mg | Freq: Once | INTRAMUSCULAR | Status: DC | PRN

## 2014-12-24 MED ORDER — SIMETHICONE 80 MG PO CHEW: 80.0000 mg | CHEWABLE_TABLET | ORAL | Status: DC | PRN

## 2014-12-24 MED ORDER — FAMOTIDINE 20 MG PO TABS
20.0000 mg | ORAL_TABLET | Freq: Two times a day (BID) | ORAL | Status: DC
  Administered 2014-12-24 – 2014-12-26 (×4): 20 mg via ORAL
  Filled 2014-12-24 (×4): qty 1

## 2014-12-24 MED ORDER — WITCH HAZEL-GLYCERIN EX PADS: 1.0000 "application " | MEDICATED_PAD | CUTANEOUS | Status: DC | PRN

## 2014-12-24 MED ORDER — IBUPROFEN 600 MG PO TABS
600.0000 mg | ORAL_TABLET | Freq: Four times a day (QID) | ORAL | Status: DC
  Administered 2014-12-24 – 2014-12-26 (×8): 600 mg via ORAL
  Filled 2014-12-24 (×8): qty 1

## 2014-12-24 MED ORDER — IBUPROFEN 600 MG PO TABS: 600.0000 mg | ORAL_TABLET | Freq: Four times a day (QID) | ORAL | Status: DC | PRN

## 2014-12-24 MED ORDER — OXYTOCIN 40 UNITS IN LACTATED RINGERS INFUSION - SIMPLE MED: 62.5000 mL/h | INTRAVENOUS | Status: AC

## 2014-12-24 MED ORDER — PHENYLEPHRINE 40 MCG/ML (10ML) SYRINGE FOR IV PUSH (FOR BLOOD PRESSURE SUPPORT)
PREFILLED_SYRINGE | INTRAVENOUS | Status: AC
  Filled 2014-12-24: qty 10

## 2014-12-24 MED ORDER — ONDANSETRON HCL 4 MG/2ML IJ SOLN
INTRAMUSCULAR | Status: DC | PRN
  Administered 2014-12-24: 4 mg via INTRAVENOUS

## 2014-12-24 MED ORDER — KETOROLAC TROMETHAMINE 30 MG/ML IJ SOLN
INTRAMUSCULAR | Status: AC
  Filled 2014-12-24: qty 1

## 2014-12-24 MED ORDER — OXYCODONE-ACETAMINOPHEN 5-325 MG PO TABS
2.0000 | ORAL_TABLET | ORAL | Status: DC | PRN
  Administered 2014-12-24 – 2014-12-26 (×7): 2 via ORAL
  Filled 2014-12-24 (×7): qty 2

## 2014-12-24 MED ORDER — KETOROLAC TROMETHAMINE 30 MG/ML IJ SOLN: 30.0000 mg | Freq: Four times a day (QID) | INTRAMUSCULAR | Status: AC | PRN

## 2014-12-24 MED ORDER — ZOLPIDEM TARTRATE 5 MG PO TABS: 5.0000 mg | ORAL_TABLET | Freq: Every evening | ORAL | Status: DC | PRN

## 2014-12-24 MED ORDER — ONDANSETRON HCL 4 MG/2ML IJ SOLN
INTRAMUSCULAR | Status: AC
  Filled 2014-12-24: qty 2

## 2014-12-24 MED ORDER — DEXAMETHASONE SODIUM PHOSPHATE 10 MG/ML IJ SOLN
INTRAMUSCULAR | Status: DC | PRN
Start: 2014-12-24 — End: 2014-12-24
  Administered 2014-12-24: 10 mg via INTRAVENOUS

## 2014-12-24 MED ORDER — DEXAMETHASONE SODIUM PHOSPHATE 10 MG/ML IJ SOLN
INTRAMUSCULAR | Status: AC
  Filled 2014-12-24: qty 1

## 2014-12-24 MED ORDER — DIBUCAINE 1 % RE OINT: 1.0000 "application " | TOPICAL_OINTMENT | RECTAL | Status: DC | PRN

## 2014-12-24 MED ORDER — DIPHENHYDRAMINE HCL 25 MG PO CAPS: 25.0000 mg | ORAL_CAPSULE | ORAL | Status: DC | PRN

## 2014-12-24 MED ORDER — SIMETHICONE 80 MG PO CHEW
80.0000 mg | CHEWABLE_TABLET | ORAL | Status: DC
  Administered 2014-12-24 – 2014-12-25 (×2): 80 mg via ORAL
  Filled 2014-12-24 (×3): qty 1

## 2014-12-24 MED ORDER — LACTATED RINGERS IV SOLN
Freq: Once | INTRAVENOUS | Status: AC
  Administered 2014-12-24: 08:00:00 via INTRAVENOUS

## 2014-12-24 MED ORDER — OXYCODONE-ACETAMINOPHEN 5-325 MG PO TABS
1.0000 | ORAL_TABLET | ORAL | Status: DC | PRN
  Administered 2014-12-24 – 2014-12-26 (×2): 1 via ORAL
  Filled 2014-12-24 (×2): qty 1

## 2014-12-24 MED ORDER — CEFAZOLIN SODIUM-DEXTROSE 2-3 GM-% IV SOLR
2.0000 g | INTRAVENOUS | Status: AC
  Administered 2014-12-24: 2 g via INTRAVENOUS

## 2014-12-24 MED ORDER — MEPERIDINE HCL 25 MG/ML IJ SOLN: 6.2500 mg | INTRAMUSCULAR | Status: DC | PRN

## 2014-12-24 MED ORDER — ACETAMINOPHEN 325 MG PO TABS
650.0000 mg | ORAL_TABLET | ORAL | Status: DC | PRN
  Administered 2014-12-24: 650 mg via ORAL
  Filled 2014-12-24: qty 2

## 2014-12-24 MED ORDER — LANOLIN HYDROUS EX OINT: 1.0000 "application " | TOPICAL_OINTMENT | CUTANEOUS | Status: DC | PRN

## 2014-12-24 MED ORDER — TETANUS-DIPHTH-ACELL PERTUSSIS 5-2.5-18.5 LF-MCG/0.5 IM SUSP
0.5000 mL | Freq: Once | INTRAMUSCULAR | Status: AC
  Administered 2014-12-26: 0.5 mL via INTRAMUSCULAR
  Filled 2014-12-24: qty 0.5

## 2014-12-24 MED ORDER — FENTANYL CITRATE (PF) 100 MCG/2ML IJ SOLN
INTRAMUSCULAR | Status: AC
  Filled 2014-12-24: qty 4

## 2014-12-24 MED ORDER — LACTATED RINGERS IV SOLN
INTRAVENOUS | Status: DC
  Administered 2014-12-24: 19:00:00 via INTRAVENOUS

## 2014-12-24 MED ORDER — SENNOSIDES-DOCUSATE SODIUM 8.6-50 MG PO TABS
2.0000 | ORAL_TABLET | ORAL | Status: DC
  Administered 2014-12-24 – 2014-12-25 (×2): 2 via ORAL
  Filled 2014-12-24 (×2): qty 2

## 2014-12-24 MED ORDER — PRENATAL MULTIVITAMIN CH
1.0000 | ORAL_TABLET | Freq: Every day | ORAL | Status: DC
  Administered 2014-12-24 – 2014-12-26 (×3): 1 via ORAL
  Filled 2014-12-24 (×3): qty 1

## 2014-12-24 MED ORDER — KETOROLAC TROMETHAMINE 30 MG/ML IJ SOLN
30.0000 mg | Freq: Four times a day (QID) | INTRAMUSCULAR | Status: AC | PRN
  Administered 2014-12-24: 30 mg via INTRAVENOUS

## 2014-12-24 MED ORDER — SIMETHICONE 80 MG PO CHEW
80.0000 mg | CHEWABLE_TABLET | Freq: Three times a day (TID) | ORAL | Status: DC
  Administered 2014-12-24 – 2014-12-26 (×5): 80 mg via ORAL
  Filled 2014-12-24 (×5): qty 1

## 2014-12-24 MED ORDER — SCOPOLAMINE 1 MG/3DAYS TD PT72
1.0000 | MEDICATED_PATCH | Freq: Once | TRANSDERMAL | Status: DC
  Administered 2014-12-24: 1.5 mg via TRANSDERMAL

## 2014-12-24 MED ORDER — PHENYLEPHRINE 8 MG IN D5W 100 ML (0.08MG/ML) PREMIX OPTIME
INJECTION | INTRAVENOUS | Status: DC | PRN
  Administered 2014-12-24: 60 ug/min via INTRAVENOUS

## 2014-12-24 MED ORDER — OXYTOCIN 10 UNIT/ML IJ SOLN
40.0000 [IU] | INTRAMUSCULAR | Status: DC | PRN
  Administered 2014-12-24: 40 [IU] via INTRAVENOUS

## 2014-12-24 MED ORDER — SCOPOLAMINE 1 MG/3DAYS TD PT72
MEDICATED_PATCH | TRANSDERMAL | Status: AC
  Filled 2014-12-24: qty 1

## 2014-12-24 MED ORDER — DIPHENHYDRAMINE HCL 25 MG PO CAPS: 25.0000 mg | ORAL_CAPSULE | Freq: Four times a day (QID) | ORAL | Status: DC | PRN

## 2014-12-24 MED ORDER — MENTHOL 3 MG MT LOZG: 1.0000 | LOZENGE | OROMUCOSAL | Status: DC | PRN

## 2014-12-24 MED ORDER — NALOXONE HCL 1 MG/ML IJ SOLN: 1.0000 ug/kg/h | INTRAMUSCULAR | Status: DC | PRN

## 2014-12-24 MED ORDER — CEFAZOLIN SODIUM-DEXTROSE 2-3 GM-% IV SOLR
INTRAVENOUS | Status: AC
  Filled 2014-12-24: qty 50

## 2014-12-24 MED ORDER — OXYTOCIN 10 UNIT/ML IJ SOLN
INTRAMUSCULAR | Status: AC
  Filled 2014-12-24: qty 4

## 2014-12-24 SURGICAL SUPPLY — 47 items
BENZOIN TINCTURE PRP APPL 2/3 (GAUZE/BANDAGES/DRESSINGS) ×3 IMPLANT
CLAMP CORD UMBIL (MISCELLANEOUS) IMPLANT
CLIP FILSHIE TUBAL LIGA STRL (Clip) ×3 IMPLANT
CLOSURE WOUND 1/2 X4 (GAUZE/BANDAGES/DRESSINGS) ×1
CLOTH BEACON ORANGE TIMEOUT ST (SAFETY) ×3 IMPLANT
DRAIN JACKSON PRT FLT 10 (DRAIN) ×3 IMPLANT
DRAPE SHEET LG 3/4 BI-LAMINATE (DRAPES) IMPLANT
DRSG OPSITE POSTOP 4X10 (GAUZE/BANDAGES/DRESSINGS) ×3 IMPLANT
DRSG OPSITE POSTOP 4X12 (GAUZE/BANDAGES/DRESSINGS) ×3 IMPLANT
DURAPREP 26ML APPLICATOR (WOUND CARE) ×3 IMPLANT
ELECT REM PT RETURN 9FT ADLT (ELECTROSURGICAL) ×3
ELECTRODE REM PT RTRN 9FT ADLT (ELECTROSURGICAL) ×1 IMPLANT
EVACUATOR SILICONE 100CC (DRAIN) ×3 IMPLANT
EXTRACTOR VACUUM KIWI (MISCELLANEOUS) IMPLANT
GLOVE BIO SURGEON ST LM GN SZ9 (GLOVE) ×3 IMPLANT
GLOVE BIOGEL PI IND STRL 9 (GLOVE) ×1 IMPLANT
GLOVE BIOGEL PI INDICATOR 9 (GLOVE) ×2
GOWN STRL REUS W/TWL 2XL LVL3 (GOWN DISPOSABLE) ×3 IMPLANT
GOWN STRL REUS W/TWL LRG LVL3 (GOWN DISPOSABLE) ×6 IMPLANT
NEEDLE HYPO 25X5/8 SAFETYGLIDE (NEEDLE) IMPLANT
NS IRRIG 1000ML POUR BTL (IV SOLUTION) ×3 IMPLANT
PACK C SECTION WH (CUSTOM PROCEDURE TRAY) ×3 IMPLANT
PAD ABD 8X7 1/2 STERILE (GAUZE/BANDAGES/DRESSINGS) ×3 IMPLANT
PAD OB MATERNITY 4.3X12.25 (PERSONAL CARE ITEMS) ×3 IMPLANT
PENCIL SMOKE EVAC W/HOLSTER (ELECTROSURGICAL) ×3 IMPLANT
RETRACTOR WND ALEXIS 25 LRG (MISCELLANEOUS) ×1 IMPLANT
RTRCTR C-SECT PINK 25CM LRG (MISCELLANEOUS) IMPLANT
RTRCTR C-SECT PINK 34CM XLRG (MISCELLANEOUS) IMPLANT
RTRCTR WOUND ALEXIS 25CM LRG (MISCELLANEOUS) ×3
SPONGE DRAIN TRACH 4X4 STRL 2S (GAUZE/BANDAGES/DRESSINGS) ×3 IMPLANT
SPONGE GAUZE 4X4 12PLY STER LF (GAUZE/BANDAGES/DRESSINGS) ×3 IMPLANT
STRIP CLOSURE SKIN 1/2X4 (GAUZE/BANDAGES/DRESSINGS) ×2 IMPLANT
SUT MNCRL 0 VIOLET CTX 36 (SUTURE) ×3 IMPLANT
SUT MONOCRYL 0 CTX 36 (SUTURE) ×6
SUT PLAIN 2 0 (SUTURE) ×2
SUT PLAIN 2 0 XLH (SUTURE) ×3 IMPLANT
SUT PLAIN ABS 2-0 54XMFL TIE (SUTURE) ×1 IMPLANT
SUT PROLENE 2 0 CT2 30 (SUTURE) ×3 IMPLANT
SUT VIC AB 0 CT1 27 (SUTURE) ×4
SUT VIC AB 0 CT1 27XBRD ANBCTR (SUTURE) ×2 IMPLANT
SUT VIC AB 2-0 CT1 27 (SUTURE) ×4
SUT VIC AB 2-0 CT1 TAPERPNT 27 (SUTURE) ×2 IMPLANT
SUT VIC AB 4-0 KS 27 (SUTURE) ×6 IMPLANT
SYR BULB IRRIGATION 50ML (SYRINGE) IMPLANT
TAPE HYPAFIX 4 X10 (GAUZE/BANDAGES/DRESSINGS) ×3 IMPLANT
TOWEL OR 17X24 6PK STRL BLUE (TOWEL DISPOSABLE) ×3 IMPLANT
TRAY FOLEY CATH SILVER 14FR (SET/KITS/TRAYS/PACK) ×3 IMPLANT

## 2014-12-24 NOTE — Transfer of Care (Signed)
Immediate Anesthesia Transfer of Care Note  Patient: Jamie Wang  Procedure(s) Performed: Procedure(s) with comments: CESAREAN SECTION WITH BILATERAL TUBAL LIGATION, wide excision of cicatrix (Bilateral) - Combined Spinal and Epidural  Patient Location: PACU  Anesthesia Type:Spinal and Epidural  Level of Consciousness: awake, alert  and oriented  Airway & Oxygen Therapy: Patient Spontanous Breathing  Post-op Assessment: Report given to RN and Post -op Vital signs reviewed and stable  Post vital signs: Reviewed and stable  Last Vitals:  Filed Vitals:   12/24/14 0735  BP: 132/85  Pulse: 110  Temp: 37.1 C  Resp: 16    Complications: No apparent anesthesia complications

## 2014-12-24 NOTE — Anesthesia Procedure Notes (Addendum)
Spinal Patient location during procedure: OR Start time: 12/24/2014 9:04 AM End time: 12/24/2014 9:08 AM Staffing Anesthesiologist: Suella Broad D Performed by: anesthesiologist  Preanesthetic Checklist Completed: patient identified, site marked, surgical consent, pre-op evaluation, timeout performed, IV checked, risks and benefits discussed and monitors and equipment checked Spinal Block Patient position: sitting Prep: DuraPrep Patient monitoring: heart rate, continuous pulse ox, blood pressure and cardiac monitor Approach: midline Location: L3-4 Injection technique: single-shot Needle Needle type: Introducer and Pencan  Needle gauge: 24 G Needle length: 9 cm Assessment Sensory level: T4 Additional Notes Negative paresthesia. Negative blood return. Positive free-flowing CSF. Expiration date of kit checked and confirmed. Patient tolerated procedure well, without complications.    Epidural Patient location during procedure: OB Start time: 12/24/2014 7:08 AM End time: 12/24/2014 7:10 AM  Staffing Anesthesiologist: Suella Broad D  Preanesthetic Checklist Completed: patient identified, site marked, surgical consent, pre-op evaluation, timeout performed, IV checked, risks and benefits discussed and monitors and equipment checked  Epidural Patient position: sitting Prep: Betadine Patient monitoring: heart rate, continuous pulse ox and blood pressure Approach: midline Location: L3-L4 Injection technique: LOR saline  Needle:  Needle type: Tuohy  Needle gauge: 18 G Needle length: 9 cm and 9 Catheter type: closed end flexible Catheter size: 20 Guage Test dose: negative and 1.5% lidocaine with Epi 1:200 K  Assessment Sensory level: T6 Events: blood not aspirated, injection not painful, no injection resistance, negative IV test and no paresthesia  Additional Notes   Patient tolerated the insertion well without complications.Reason for block:procedure for pain

## 2014-12-24 NOTE — Interval H&P Note (Signed)
History and Physical Interval Note:  12/24/2014 8:33 AM  Jamie Wang  has presented today for surgery, with the diagnosis of PREVIOUS C/S;DESIRE STERILZATION  The various methods of treatment have been discussed with the patient and family. After consideration of risks, benefits and other options for treatment, the patient has consented to  Procedure(s): CESAREAN SECTION WITH BILATERAL TUBAL LIGATION (Bilateral) as a surgical intervention, with wide excision of cicatrix. .  The patient's history has been reviewed, patient examined, no change in status, stable for surgery.  I have reviewed the patient's chart and labs.  Questions were answered to the patient's satisfaction.     Tilda Burrow

## 2014-12-24 NOTE — Anesthesia Postprocedure Evaluation (Signed)
  Anesthesia Post-op Note  Patient: Jamie Wang  Procedure(s) Performed: Procedure(s) with comments: CESAREAN SECTION WITH BILATERAL TUBAL LIGATION, wide excision of cicatrix (Bilateral) - Combined Spinal and Epidural  Patient Location: PACU  Anesthesia Type:Spinal and Epidural  Level of Consciousness: awake, alert  and oriented  Airway and Oxygen Therapy: Patient Spontanous Breathing  Post-op Pain: none  Post-op Assessment: Post-op Vital signs reviewed and Patient's Cardiovascular Status Stable LLE Motor Response: Purposeful movement LLE Sensation: Tingling RLE Motor Response: Purposeful movement RLE Sensation: Tingling      Post-op Vital Signs: Reviewed and stable  Last Vitals:  Filed Vitals:   12/24/14 1145  BP: 109/68  Pulse: 71  Temp:   Resp: 22    Complications: No apparent anesthesia complications

## 2014-12-24 NOTE — Lactation Note (Signed)
This note was copied from the chart of Jamie Wang. Lactation Consultation Note Initial visit at 10 hours of age.  Mom attempting to latch now.  Mom had older child in NICU and did pumping for a few months.  Baby is in sitting up football hold on left breast.  Assisted with breast compression and baby latches well with wide flanged lips and strong sucking bursts.  Mom denies pain. Stimulated baby to maintain feeding for about 8 minutes.  Baby slipped off the nipple asleep, nipple sightly compressed on tip. Encouraged mom to continue to work on depth and use pillows for support.  Tech in to do bath, MBU RN to assess mom. Mom denies much changes in her breasts during pregnancy and reports left is much larger than left.  May need further evaluation at future visit. [facility] LC resources given and discussed.  Encouraged to feed with early cues on demand.  Early newborn behavior discussed.  Hand expression demonstrated with colostrum visible.  Mom to call for assist as needed.       Patient Name: Jamie Wang [id number] Date: 12/24/2014 Reason for consult: Initial assessment   Maternal Data Has patient been taught Hand Expression?: Yes Does the patient have breastfeeding experience prior to this delivery?: Yes  Feeding Feeding Type: Breast Fed Length of feed: 8 min  LATCH Score/Interventions Latch: Repeated attempts needed to sustain latch, nipple held in mouth throughout feeding, stimulation needed to elicit sucking reflex. Intervention(s): Adjust position;Assist with latch;Breast massage;Breast compression  Audible Swallowing: A few with stimulation  Type of Nipple: Everted at rest and after stimulation  Comfort (Breast/Nipple): Soft / non-tender     Hold (Positioning): Assistance needed to correctly position infant at breast and maintain latch. Intervention(s): Breastfeeding basics reviewed;Support Pillows;Position options;Skin to skin  LATCH Score: 7  Lactation Tools  Discussed/Used     Consult Status Consult Status: Follow-up Date: 12/25/14 Follow-up type: In-patient    Amerie Beaumont, Arvella Merles 12/24/2014, 8:35 PM

## 2014-12-24 NOTE — Consult Note (Signed)
Neonatology Note:   Attendance at C-section:    I was asked by Dr. Emelda Fear to attend this repeat C/S at term. The mother is a G2P1 A pos, GBS not found with an uncomplicated pregnancy. Fetal ultrasound showed an isolated intracardiac echogenic focus. ROM at delivery, fluid clear. Infant vigorous with good spontaneous cry and tone. Needed only minimal bulb suctioning. Ap 9/9. Lungs clear to ausc in DR. To CN to care of Pediatrician.   Doretha Sou, MD

## 2014-12-24 NOTE — Anesthesia Preprocedure Evaluation (Addendum)
Anesthesia Evaluation  Patient identified by MRN, date of birth, ID band Patient awake    Reviewed: Allergy & Precautions, NPO status , Patient's Chart, lab work & pertinent test results  Airway Mallampati: III  TM Distance: >3 FB Neck ROM: Full    Dental no notable dental hx. (+) Teeth Intact   Pulmonary neg pulmonary ROS,  breath sounds clear to auscultation  Pulmonary exam normal       Cardiovascular negative cardio ROS Normal cardiovascular examRhythm:Regular Rate:Normal     Neuro/Psych Anxiety negative neurological ROS     GI/Hepatic Neg liver ROS, GERD-  Medicated and Controlled,  Endo/Other  negative endocrine ROSMorbid obesity  Renal/GU negative Renal ROS     Musculoskeletal   Abdominal (+) + obese,   Peds  Hematology  (+) anemia ,   Anesthesia Other Findings   Reproductive/Obstetrics (+) Pregnancy Previous C/Section Desires Sterilization                             Anesthesia Physical Anesthesia Plan  ASA: III  Anesthesia Plan: Combined Spinal and Epidural   Post-op Pain Management:    Induction: Intravenous  Airway Management Planned: Natural Airway  Additional Equipment:   Intra-op Plan:   Post-operative Plan:   Informed Consent: I have reviewed the patients History and Physical, chart, labs and discussed the procedure including the risks, benefits and alternatives for the proposed anesthesia with the patient or authorized representative who has indicated his/her understanding and acceptance.     Plan Discussed with: CRNA, Anesthesiologist and Surgeon  Anesthesia Plan Comments:        Anesthesia Quick Evaluation

## 2014-12-24 NOTE — Op Note (Signed)
12/24/2014  10:58 AM  PATIENT:  Jamie Wang  39 y.o. female  PRE-OPERATIVE DIAGNOSIS:  PRV C/S;DESIRE STERILZATION  POST-OPERATIVE DIAGNOSIS:  PRV C/S;DESIRE STERILZATION  PROCEDURE:  Procedure(s) with comments: CESAREAN SECTION WITH BILATERAL TUBAL LIGATION, wide excision of cicatrix (Bilateral) - Combined Spinal and Epidural  SURGEON:  Surgeon(s) and Role:    * Tilda Burrow, MD - Primary    * Kathrynn Running, MD - Fellow  PHYSICIAN ASSISTANT: * Kathrynn Running, MD - Fellow   ASSISTANTS: none   ANESTHESIA:   epidural and spinal  EBL:  Total I/O In: 1000 [I.V.:1000] Out: 800 [Urine:100; Blood:700]  BLOOD ADMINISTERED:none  DRAINS: (10 mm) Jackson-Pratt drain(s) with closed bulb suction in the Subcutaneous space and Urinary Catheter (Foley)   LOCAL MEDICATIONS USED:  NONE  SPECIMEN:  Source of Specimen:  Placenta to labor and delivery  DISPOSITION OF SPECIMEN:  N/A subcutaneous fatty tissue and cicatrix discarded  COUNTS:  YES  TOURNIQUET:  * No tourniquets in log *  DICTATION: .Dragon Dictation  PLAN OF CARE: Admit to inpatient   PATIENT DISPOSITION:  PACU - hemodynamically stable.   Delay start of Pharmacological VTE agent (>24hrs) due to surgical blood loss or risk of bleeding: not applicable Indications: A 39 year old female prior cesarean section declining trial of labor, requesting permanent sterilization, also requesting wide excision of cicatrix due to irregular healing and dimpling status post previous cesarean and an area of otherwise lax tissues.  Details of procedure: Patient was taken operating room prepped and draped for lower abdominal surgery after combined spinal epidural were placed, timeout conducted and Ancef administered 2 g and procedure confirmed by surgical team. Transverse lower abdominal incision was performed length of approximate 40 cm, 2 cm inferior to the previous scar line with sharp dissection 2 cm with Bovie cautery used  for efforts at hemostasis. The purpose was removed cephalad with the central dissection identifying an area of firm fibrotic tissue in the midline where the previous cesarean had been performed. We sharply dissected down to the fascia at the inferior aspects of this fibrotic tissue. Fascia was identified below the fibrosis, and transversely opened and sharply dissected off the rectus muscles. Her nail cavity was entered at the upper most aspects of the midline section, then the fascia mobilized inferiorly from the underlying muscles incision was carefully opened inferiorly down to the bladder. Once adequate access was achieved by Alexis wound retractor was positioned and bladder flap developed and transverse uterine incision performed revealing clear amniotic fluid. Incision was opened transversely and then cephalad and caudad by blunt traction, and the fetal vertex guided through the incision with fundal pressure. Infant's cord was clamped and baby was passed to the waiting pediatrician see their notes for details. Placenta delivered easily intact Tomasa Blase presentation with normal blood loss uterine tone was good and response to IV oxytocin. Uterus was closed in a running locking first layer of 0 Monocryl followed by continuous running second layer of 0 Monocryl. Tubal ligation: Once hemostasis was confirmed attention was directed to the fallopian tubes. Each fallopian tube was identified to its fimbriated end, with a Filshie clip placed at its midportion with visual confirmation of proper placement and stability. Laparotomy equipment was removed, anterior peritoneum closed with running 2-0 Vicryl and attention to some oozing from the backside of the fascia where the rectus muscles had perforators, on the right side. Hemostasis was achieved and observed for a minute or 2. Fascia was then closed in a continuous running  fashion using 0 Vicryl. I then returned to addressing the redundant skin with fibrosis. The  fibrotic area just above the fascia was sharply excised using point cautery, as necessary,to achieve hemostasis. The fatty tissues were then reapproximated with a series of interrupted 20 plain sutures placed in a horizontal mattress fashion with good skin approximation anticipated. A subcutaneous drain 10 mm was then placed in the deep subcutaneous tissues and allowed to exit through a stab incision at the left side of the incision, and subsequent sewn in place with 2-0 proline Skin itself was then closed with running 4-0 Vicryl on a Keith needle beginning at each side and sewing to the midline. Steri-Strips and benzoin were applied Skin edge approximation was good, sponge and needle counts correct, patient to recovery in stable condition.

## 2014-12-24 NOTE — Brief Op Note (Signed)
12/24/2014  10:58 AM  PATIENT:  Jamie Wang  39 y.o. female  PRE-OPERATIVE DIAGNOSIS:  PRV C/S;DESIRE STERILZATION  POST-OPERATIVE DIAGNOSIS:  PRV C/S;DESIRE STERILZATION  PROCEDURE:  Procedure(s) with comments: CESAREAN SECTION WITH BILATERAL TUBAL LIGATION, wide excision of cicatrix (Bilateral) - Combined Spinal and Epidural  SURGEON:  Surgeon(s) and Role:    * Tilda Burrow, MD - Primary    * Kathrynn Running, MD - Fellow  PHYSICIAN ASSISTANT: * Kathrynn Running, MD - Fellow   ASSISTANTS: none   ANESTHESIA:   epidural and spinal  EBL:  Total I/O In: 1000 [I.V.:1000] Out: 800 [Urine:100; Blood:700]  BLOOD ADMINISTERED:none  DRAINS: (10 mm) Jackson-Pratt drain(s) with closed bulb suction in the Subcutaneous space and Urinary Catheter (Foley)   LOCAL MEDICATIONS USED:  NONE  SPECIMEN:  Source of Specimen:  Placenta to labor and delivery  DISPOSITION OF SPECIMEN:  N/A subcutaneous fatty tissue and cicatrix discarded  COUNTS:  YES  TOURNIQUET:  * No tourniquets in log *  DICTATION: .Dragon Dictation  PLAN OF CARE: Admit to inpatient   PATIENT DISPOSITION:  PACU - hemodynamically stable.   Delay start of Pharmacological VTE agent (>24hrs) due to surgical blood loss or risk of bleeding: not applicable

## 2014-12-25 ENCOUNTER — Encounter (HOSPITAL_COMMUNITY): Payer: Self-pay | Admitting: Obstetrics and Gynecology

## 2014-12-25 DIAGNOSIS — Z98891 History of uterine scar from previous surgery: Secondary | ICD-10-CM

## 2014-12-25 HISTORY — DX: History of uterine scar from previous surgery: Z98.891

## 2014-12-25 LAB — CBC
HEMATOCRIT: 30.2 % — AB (ref 36.0–46.0)
Hemoglobin: 10.2 g/dL — ABNORMAL LOW (ref 12.0–15.0)
MCH: 30.3 pg (ref 26.0–34.0)
MCHC: 33.8 g/dL (ref 30.0–36.0)
MCV: 89.6 fL (ref 78.0–100.0)
Platelets: 203 10*3/uL (ref 150–400)
RBC: 3.37 MIL/uL — ABNORMAL LOW (ref 3.87–5.11)
RDW: 13.9 % (ref 11.5–15.5)
WBC: 12.1 10*3/uL — ABNORMAL HIGH (ref 4.0–10.5)

## 2014-12-25 LAB — BIRTH TISSUE RECOVERY COLLECTION (PLACENTA DONATION)

## 2014-12-25 NOTE — Clinical Social Work Maternal (Signed)
  CLINICAL SOCIAL WORK MATERNAL/CHILD NOTE  Patient Details  Name: Jamie Wang MRN: [id number] Date of Birth: 07/01/1975  Date:  12/25/2014  Clinical Social Worker Initiating Note:  Charish Schroepfer, LCSW Date/ Time Initiated:  12/25/14/1000     Child's Name:  Jamie Wang   Legal Guardian:  Shane (MOB) and Jimmy (FOB)   Need for Interpreter:  None   Date of Referral:  12/24/14     Reason for Referral:  History of anxiety   Referral Source:  Central Nursery   Address:  [address] [place], [place] [address]  Phone number:  [phone]   Household Members:  Minor Children, Spouse   Natural Supports (not living in the home):  Extended Family, Immediate Family   Professional Supports: None   Employment: Full-time   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 FOB and maternal grandmother were present in the room while CSW conducted the assessment. MOB expressed excitement in being  a mother again  for the second time and being grateful the infant did not have to go through the same experience her two year old daughter did. Per MOB her first child was born after an emergency C-section and was admitted into the NICU for 77 days. MOB discussed how these events led to her anxiety and PTSD.  MOB expressed great awareness about her anxiety and also stated her family was very aware of it as well and  supportive.  MOB displayed anxiety while speaking as evidenced by exhibiting slightly pressured speech and faster rate and rhythm.  MOB discussed her firstborn's NICU experience and how it triggers anxiety when she leaves her firstborn in the care of others. She mentioned the fear of losing control and the fear of her daughter dying, but also expressed her awareness of it being irrational thinking. MOB mentioned that her aunt is a NP and that they have discussed anti-anxiety medications safe for her to be prescribed while breastfeeding. MOB voiced intention to speak with her OB provider during her postpartum visit to discuss the possibility of starting medications. MOB discussed wanting to be proactive in regards to her anxiety and discussed some of the benefits if she started taking medications before her anxiety worsens.  MOB denied having any further questions or concerns but agreed to contact CSW if needs arise.   CSW Plan/Description:  No Further Intervention Required/No Barriers to Discharge    Aerial Dilley N, LCSW 12/25/2014, 11:41 AM 

## 2014-12-25 NOTE — Progress Notes (Signed)
Post Partum/Post-Op Day 1  Subjective:  Shamika Pedregon is a 39 y.o. [id number] [redacted]w[redacted]d s/p rLTCS/BTL with drain in place.  No acute events overnight. Does report a lot of drainage out of the tube. Pt denies problems with ambulating, voiding or po intake.  She denies nausea or vomiting.  Pain is moderately controlled.  She has not had flatus. She has not had bowel movement.  Lochia Large.  Plan for birth control is bilateral tubal ligation.  Method of Feeding: Breast.  Objective: BP 118/70 mmHg  Pulse 63  Temp(Src) 98.3 F (36.8 C) (Oral)  Resp 18  Ht  (1.626 m)  Wt 250 lb 6 oz (113.569 kg)  BMI 42.96 kg/m2  SpO2 95%  LMP 03/25/2014  Breastfeeding? Unknown  Physical Exam:  General: alert, cooperative and no distress Lochia:normal flow Chest: non-labored breathing Heart: RRR  Abdomen: soft, nontender, fundus firm at/below umbilicus Uterine Fundus: firm DVT Evaluation: No evidence of DVT seen on physical exam. Extremities: no edema  No results for input(s): HGB, HCT in the last 72 hours.  Assessment/Plan:  ASSESSMENT: Shelva Hetzer is a 39 y.o. G2P1102 [redacted]w[redacted]d POD#1 s/p rLTCS and BTL doing well.   Plan for discharge tomorrow   LOS: 1 day   Tarri Abernethy 12/25/2014, 6:07 AM

## 2014-12-26 DIAGNOSIS — Z98891 History of uterine scar from previous surgery: Secondary | ICD-10-CM

## 2014-12-26 MED ORDER — OXYCODONE-ACETAMINOPHEN 5-325 MG PO TABS: 2.0000 | ORAL_TABLET | ORAL | Status: DC | PRN

## 2014-12-26 NOTE — Discharge Summary (Signed)
OB Discharge Summary  Patient Name: Jamie Wang DOB: March 21, 1976 MRN: [id number]  Date of admission: 12/24/2014 Delivering MD: Tilda Burrow   Date of discharge:12/26/2014  Admitting diagnosis: Scheduled Cesarean section with tubal ligation and wide excision of old cicatrix   Intrauterine pregnancy: [redacted]w[redacted]d     Secondary diagnosis: Elective sterilization     Discharge diagnosis: Term Pregnancy Delivered and Sterilization                                                                                            Post partum procedures:Jackson-Pratt drain in the subcutaneous space placed at the time of cesarean section and cicatrix removal  Augmentation:   Complications:None  Hospital course:  Sceduled C/S   39 y.o. yo G2P1102 at [redacted]w[redacted]d was admitted to the hospital 12/24/2014 for scheduled cesarean section with the following indication:Elective Repeat with permanent sterilization.  Patient delivered a Viable infant.12/24/2014  Details of operation can be found in separate operative note.  Pateint had an uncomplicated postpartum course.  She is ambulating, tolerating a regular diet, passing flatus, and urinating well. Patient is discharged home in stable condition on No discharge date for patient encounter.       the patient will be seen in 24 hours for removal of the flat Jackson-Pratt drain from the subcutaneous space and the incision   Physical exam  Filed Vitals:   12/25/14 0515 12/25/14 0904 12/25/14 1700 12/26/14 0521  BP: 118/70 105/59 119/62 135/87  Pulse: 63 74 75 92  Temp: 98.3 F (36.8 C) 98.4 F (36.9 C) 97.4 F (36.3 C) 98.1 F (36.7 C)  TempSrc: Oral Oral Oral Oral  Resp: Height:      Weight:      SpO2:  100%     General: alert, cooperative and no distress Lochia: appropriate Uterine Fundus: firm Incision: Healing well with no significant drainage, approximately 60 cc of serous fluid out in the last 24 hours DVT Evaluation: No evidence of DVT seen on  physical exam. Labs: Lab Results  Component Value Date   WBC 12.1* 12/25/2014   HGB 10.2* 12/25/2014   HCT 30.2* 12/25/2014   MCV 89.6 12/25/2014   PLT 203 12/25/2014   No flowsheet data found.  Discharge instruction: per After Visit Summary and "Baby and Me Booklet".  Medications:   Medication List    TAKE these medications        acetaminophen 500 MG tablet  Commonly known as:  TYLENOL  Take 1,000 mg by mouth every 6 (six) hours as needed for mild pain or headache.     cromolyn 5.2 MG/ACT nasal spray  Commonly known as:  NASALCROM  Place 1 spray into both nostrils daily as needed.     loratadine 10 MG tablet  Commonly known as:  CLARITIN  Take 10 mg by mouth daily.     oxyCODONE-acetaminophen 5-325 MG per tablet  Commonly known as:  PERCOCET/ROXICET  Take 2 tablets by mouth every 4 (four) hours as needed (for pain scale greater than 7).     PRENATAL VITAMIN PO  Take 1 tablet by  mouth daily.     ranitidine 150 MG tablet  Commonly known as:  ZANTAC  Take 150 mg by mouth 2 (two) times daily.        Diet: routine diet  Activity: Advance as tolerated. Pelvic rest for 6 weeks.   Outpatient follow up:1 day, then 2 weeks  Postpartum contraception: Tubal Ligation  Newborn Data: Live born female  Birth Weight: 7 lb 13.2 oz (3550 g) APGAR: 9, 9  Baby Feeding: Breast Disposition:home with mother   12/26/2014 Tilda Burrow, MD

## 2014-12-26 NOTE — Lactation Note (Signed)
This note was copied from the chart of Jamie Katharina Jehle. Lactation Consultation Note  Patient Name: Jamie Wang Date: 12/26/2014 Reason for consult: Follow-up assessment Mom had questions regarding pump rental, breast milk storage. Questions answered. LC noted baby not nursing for greater than 10 minutes with most feedings and Mom not supplementing consistently. Reviewed with Mom importance of baby nursing 8-12 times or more in 24 hours and for greater than 10 minutes to encourage milk production and protect milk supply. Mom reports baby falling asleep at the breast and reports nipple tenderness. LC offered to assist Mom before d/c with latch, Mom declined. LC advised Mom to pump if baby not sustaining the latch at breast, supplement with EBM or formula if needed if baby not latching or sustaining the latch. Mom to call for question/concerns. Will advise if needs pump rental.   Maternal Data    Feeding Feeding Type: Breast Fed  LATCH Score/Interventions                      Lactation Tools Discussed/Used     Consult Status Consult Status: Complete Date: 12/26/14    Alfred Levins 12/26/2014, 12:59 PM

## 2014-12-26 NOTE — Discharge Instructions (Signed)

## 2014-12-26 NOTE — Lactation Note (Signed)
This note was copied from the chart of Jamie Medha Pippen. Lactation Consultation Note Mom stating that she is going to breast and bottle feed. She plans on mainly breast feeding for a couple of weeks. Is going to use her own formula. Mom wants the baby to mainly get the colostrum and the milk for a few weeks then will give all formula d/t going back to work and will not pump and wants her milk to be dried up before going back to work.  Discussed engorgement, positioning for deep latch. Encouraged to be comfortable during BF, gave pillows behind back. Mom kept falling asleep holding baby in recliner in football position. Encouraged to get into bed for resting, mom states to uncomfortable. I put baby in [place] and started crying. Mom took baby back, baby gets shallow on nipple, mom has large breast w/everted nipples and hard for mom to see flange. Gave tips. Encouraged dad to get baby so mom could sleep. Patient Name: Jamie Wang [id number] Date: 12/26/2014 Reason for consult: Follow-up assessment;Breast/nipple pain;Difficult latch   Maternal Data    Feeding Feeding Type: Breast Fed Length of feed: 10 min  LATCH Score/Interventions Latch: Grasps breast easily, tongue down, lips flanged, rhythmical sucking. Intervention(s): Adjust position;Assist with latch;Breast massage;Breast compression  Audible Swallowing: Spontaneous and intermittent Intervention(s): Skin to skin;Hand expression;Alternate breast massage  Type of Nipple: Everted at rest and after stimulation  Comfort (Breast/Nipple): Filling, red/small blisters or bruises, mild/mod discomfort  Problem noted: Mild/Moderate discomfort Interventions (Mild/moderate discomfort): Comfort gels;Hand massage;Hand expression  Hold (Positioning): Assistance needed to correctly position infant at breast and maintain latch. Intervention(s): Support Pillows;Position options;Skin to skin  LATCH Score: 8  Lactation Tools  Discussed/Used Tools: Comfort gels   Consult Status Consult Status: Complete    Tarren Velardi G 12/26/2014, 6:41 AM

## 2014-12-27 ENCOUNTER — Encounter: Payer: Self-pay | Admitting: Obstetrics and Gynecology

## 2014-12-27 ENCOUNTER — Ambulatory Visit (INDEPENDENT_AMBULATORY_CARE_PROVIDER_SITE_OTHER): Payer: BC Managed Care – PPO | Admitting: Obstetrics and Gynecology

## 2014-12-27 VITALS — BP 100/64 | Ht 64.0 in | Wt 254.0 lb

## 2014-12-27 DIAGNOSIS — Z9889 Other specified postprocedural states: Secondary | ICD-10-CM

## 2014-12-27 DIAGNOSIS — Z09 Encounter for follow-up examination after completed treatment for conditions other than malignant neoplasm: Secondary | ICD-10-CM | POA: Insufficient documentation

## 2014-12-27 MED ORDER — HYDROCHLOROTHIAZIDE 25 MG PO TABS: 25.0000 mg | ORAL_TABLET | Freq: Every day | ORAL | Status: DC

## 2014-12-27 NOTE — Progress Notes (Signed)
Patient ID: Jamie Wang, female   DOB: [birth year], 39 y.o.   MRN: [id number] Pt here today for post op. Pt states that she has had pain and swelling. Pt states that she is still getting a lot of drainage from her JP drain.

## 2014-12-27 NOTE — Progress Notes (Signed)
Patient ID: Jamie Wang, female   DOB: 1975/08/05, 39 y.o.   MRN: [id number]    Subjective:  Ignacia Gentzler is a 39 y.o. female now 3 days status post cesarean section .  Pt presents today for post op follow-up. Pt complains of pain and swelling to her abd and reports ongoing high volume drainage from JP drain of 100 cc last 24 hr  Review of Systems Negative except pain and swelling to abd.    Bowel movements : normal.  Pain is controlled with current analgesics. Medications being used: narcotic analgesics including percocet.  Objective:  BP 100/64 mmHg  Ht  (1.626 m)  Wt 254 lb (115.214 kg)  BMI 43.58 kg/m2  Breastfeeding? Yes General:Well developed, well nourished.  No acute distress. Abdomen: Bowel sounds normal, soft, non-tender. Pelvic Exam: Deferred Abdomen: Soft, non-tender  Incision(s):   Erythematous but healing well, moderate clear drainage, no hernia, 3+ edema, no dehiscence,     Assessment:  Post-Op 3 days weeks s/p cesarean section    Doing well postoperatively.   Plan:  1. Wound care discussed   2. Activity restrictions: unchanged 3. return to work: not applicable. 4. Follow up in 4 weeks. 5. Resume showers.   This chart was SCRIBED for Christin Bach, MD by Marica Otter, ED Scribe. This patient was seen in room 2, and the patient's care was started at 12:55 PM.  I personally performed the services described in this documentation, which was SCRIBED in my presence. The recorded information has been reviewed and considered accurate. It has been edited as necessary during review. Tilda Burrow, MD

## 2014-12-28 LAB — TYPE AND SCREEN
ABO/RH(D): A POS
Antibody Screen: NEGATIVE
Unit division: 0
Unit division: 0

## 2014-12-31 ENCOUNTER — Ambulatory Visit (INDEPENDENT_AMBULATORY_CARE_PROVIDER_SITE_OTHER): Payer: BC Managed Care – PPO | Admitting: Obstetrics and Gynecology

## 2014-12-31 ENCOUNTER — Encounter: Payer: Self-pay | Admitting: Obstetrics and Gynecology

## 2014-12-31 VITALS — BP 120/76 | Ht 64.0 in | Wt 240.0 lb

## 2014-12-31 DIAGNOSIS — Z4889 Encounter for other specified surgical aftercare: Secondary | ICD-10-CM

## 2014-12-31 NOTE — Progress Notes (Signed)
Patient ID: Jamie Wang, female   DOB: 1976-04-17, 39 y.o.   MRN: [id number] Pt here today for post op visit. Pt states that the fluid is clearer than Friday but she is still getting a good amount of fluid. Pt states that the incision is opening a little on the right side.     Subjective:  Jamie Wang is a 39 y.o. female now 1 weeks status post cesarean.     Review of Systems Negative except clearing and reducing drainage from drain   Diet:   Reg    Bowel movements : normal.  Pain is controlled with current analgesics. Medications being used: narcotic analgesics including vicodin.  Objective:  BP 120/76 mmHg  Ht  (1.626 m)  Wt 240 lb (108.863 kg)  BMI 41.18 kg/m2  Breastfeeding? Yes General:Well developed, well nourished.  No acute distress. Abdomen: Bowel sounds normal, soft, non-tender. P Incision(s):   Healing wwell, , no drainage, no erythema, no hernia, no swelling, no dehiscence,  JP drain removed   Assessment:  Post-Op 1 weeks s/p    c/s Doing well postoperatively.   Plan:  1.Wound care discussed   2. . current medications. 3. Activity restrictions: unchanged 4. return to work: 4 weeks. 5. Follow up in 4 weeks.

## 2015-01-03 ENCOUNTER — Encounter: Payer: Self-pay | Admitting: Obstetrics & Gynecology

## 2015-01-03 ENCOUNTER — Ambulatory Visit (INDEPENDENT_AMBULATORY_CARE_PROVIDER_SITE_OTHER): Payer: BC Managed Care – PPO | Admitting: Obstetrics & Gynecology

## 2015-01-03 VITALS — BP 120/82 | Wt 232.0 lb

## 2015-01-03 DIAGNOSIS — L03311 Cellulitis of abdominal wall: Secondary | ICD-10-CM

## 2015-01-03 DIAGNOSIS — T814XXA Infection following a procedure, initial encounter: Secondary | ICD-10-CM

## 2015-01-03 DIAGNOSIS — T8140XA Infection following a procedure, unspecified, initial encounter: Secondary | ICD-10-CM

## 2015-01-03 MED ORDER — AMOXICILLIN-POT CLAVULANATE 875-125 MG PO TABS: 1.0000 | ORAL_TABLET | Freq: Two times a day (BID) | ORAL | Status: DC

## 2015-01-03 NOTE — Progress Notes (Signed)
Patient ID: Jamie Wang, female   DOB: 1975-09-26, 39 y.o.   MRN: [id number] Chief Complaint  Patient presents with  . Wound Check    Recheck incision     Blood pressure 120/82, weight 232 lb (105.235 kg), currently breastfeeding.  39 y.o. [id number] No LMP recorded. The current method of family planning is tubal ligation.  Subjective Pt with with increasingly red and firm and warmer Had a drain in out for 3 days  Objective Abdomen incision area with induration and erythema and is warm to touch  Pertinent ROS Some constitutional symptoms with body aches and sweats fever  Labs or studies     Impression Diagnoses this Encounter::   ICD-9-CM ICD-10-CM   1. Post op infection 998.59 T81.4XXA   2. Cellulitis of abdominal wall 682.2 L03.311     Established relevant diagnosis(es):   Plan/Recommendations: Meds ordered this encounter  Medications  . amoxicillin-clavulanate (AUGMENTIN) 875-125 MG per tablet    Sig: Take 1 tablet by mouth 2 (two) times daily.    Dispense:  20 tablet    Refill:  0    Labs or Scans Ordered: No orders of the defined types were placed in this encounter.      Follow up Return in about 6 days (around 01/09/2015) for recheck, with Dr Despina Hidden.        All questions were answered.

## 2015-01-09 ENCOUNTER — Ambulatory Visit (INDEPENDENT_AMBULATORY_CARE_PROVIDER_SITE_OTHER): Payer: BC Managed Care – PPO | Admitting: Obstetrics & Gynecology

## 2015-01-09 ENCOUNTER — Encounter: Payer: Self-pay | Admitting: Obstetrics & Gynecology

## 2015-01-09 VITALS — BP 108/72 | HR 60 | Ht 64.0 in | Wt 230.0 lb

## 2015-01-09 DIAGNOSIS — L03311 Cellulitis of abdominal wall: Secondary | ICD-10-CM

## 2015-01-09 MED ORDER — ESCITALOPRAM OXALATE 10 MG PO TABS: 10.0000 mg | ORAL_TABLET | Freq: Every day | ORAL | Status: DC

## 2015-01-09 NOTE — Progress Notes (Signed)
Patient ID: Jamie Wang, female   DOB: Aug 28, 1975, 39 y.o.   MRN: [id number] Chief Complaint  Patient presents with  . post op infection    recheck incision     Blood pressure 108/72, pulse 60, height  (1.626 m), weight 230 lb (104.327 kg), not currently breastfeeding.  39 y.o. [id number] No LMP recorded. The current method of family planning is tubal ligation.  Subjective Follow up for post op celluilitis  Objective Incision looks better healing well BV placed  Pertinent ROS   Labs or studies     Impression Diagnoses this Encounter::   ICD-9-CM ICD-10-CM   1. Cellulitis of abdominal wall 682.2 L03.311     Established relevant diagnosis(es): Also having some depression symptoms, was on before start lexapro  Plan/Recommendations: Meds ordered this encounter  Medications  . escitalopram (LEXAPRO) 10 MG tablet    Sig: Take 1 tablet (10 mg total) by mouth daily.    Dispense:  30 tablet    Refill:  3    Labs or Scans Ordered: No orders of the defined types were placed in this encounter.      Follow up Return in about 2 weeks (around 01/23/2015) for Follow up.

## 2015-01-23 ENCOUNTER — Encounter: Payer: Self-pay | Admitting: Obstetrics & Gynecology

## 2015-01-23 ENCOUNTER — Ambulatory Visit (INDEPENDENT_AMBULATORY_CARE_PROVIDER_SITE_OTHER): Payer: BC Managed Care – PPO | Admitting: Obstetrics & Gynecology

## 2015-01-23 ENCOUNTER — Encounter: Payer: BC Managed Care – PPO | Admitting: Obstetrics and Gynecology

## 2015-01-23 VITALS — BP 100/60 | Ht 64.0 in | Wt 235.5 lb

## 2015-01-23 DIAGNOSIS — L03311 Cellulitis of abdominal wall: Secondary | ICD-10-CM

## 2015-01-23 DIAGNOSIS — Z9889 Other specified postprocedural states: Secondary | ICD-10-CM

## 2015-01-23 NOTE — Progress Notes (Signed)
Patient ID: Jamie Wang, female   DOB: 1975/07/09, 39 y.o.   MRN: [id number] Post op 5 weeks from a repeat section ith post operative complication of wound cellulits now essentially resolved Some skin changes consistent with lymphatic disruption, some warmth as well due to it  Overall incision is healing well  Follow up prn Continue lexpro 10, can go up if needed

## 2015-02-01 ENCOUNTER — Other Ambulatory Visit: Payer: Self-pay | Admitting: Obstetrics and Gynecology

## 2015-03-29 ENCOUNTER — Other Ambulatory Visit: Payer: Self-pay | Admitting: Obstetrics and Gynecology

## 2015-04-01 NOTE — Telephone Encounter (Signed)
Needs appt

## 2015-08-06 ENCOUNTER — Telehealth: Payer: Self-pay | Admitting: Adult Health

## 2015-08-06 MED ORDER — ESCITALOPRAM OXALATE 20 MG PO TABS: 20.0000 mg | ORAL_TABLET | Freq: Every day | ORAL | Status: DC

## 2015-08-06 NOTE — Telephone Encounter (Signed)
Pt called request increase in lexapro, will increase to 20 mg

## 2016-01-28 ENCOUNTER — Other Ambulatory Visit: Payer: Self-pay | Admitting: Adult Health

## 2016-06-29 ENCOUNTER — Ambulatory Visit (INDEPENDENT_AMBULATORY_CARE_PROVIDER_SITE_OTHER): Payer: BC Managed Care – PPO | Admitting: Adult Health

## 2016-06-29 ENCOUNTER — Other Ambulatory Visit (HOSPITAL_COMMUNITY)
Admission: RE | Admit: 2016-06-29 | Discharge: 2016-06-29 | Disposition: A | Discharge status: Home / Self Care | Payer: BC Managed Care – PPO | Source: Ambulatory Visit | Attending: Adult Health | Admitting: Adult Health

## 2016-06-29 ENCOUNTER — Encounter: Payer: Self-pay | Admitting: Adult Health

## 2016-06-29 VITALS — BP 130/80 | HR 82 | Ht 64.0 in | Wt 260.0 lb

## 2016-06-29 DIAGNOSIS — N921 Excessive and frequent menstruation with irregular cycle: Secondary | ICD-10-CM

## 2016-06-29 DIAGNOSIS — Z131 Encounter for screening for diabetes mellitus: Secondary | ICD-10-CM | POA: Insufficient documentation

## 2016-06-29 DIAGNOSIS — F419 Anxiety disorder, unspecified: Secondary | ICD-10-CM

## 2016-06-29 DIAGNOSIS — N946 Dysmenorrhea, unspecified: Secondary | ICD-10-CM | POA: Diagnosis not present

## 2016-06-29 DIAGNOSIS — Z01419 Encounter for gynecological examination (general) (routine) without abnormal findings: Secondary | ICD-10-CM

## 2016-06-29 DIAGNOSIS — Z1211 Encounter for screening for malignant neoplasm of colon: Secondary | ICD-10-CM

## 2016-06-29 DIAGNOSIS — Z01411 Encounter for gynecological examination (general) (routine) with abnormal findings: Secondary | ICD-10-CM

## 2016-06-29 DIAGNOSIS — Z1151 Encounter for screening for human papillomavirus (HPV): Secondary | ICD-10-CM | POA: Diagnosis present

## 2016-06-29 DIAGNOSIS — Z7689 Persons encountering health services in other specified circumstances: Secondary | ICD-10-CM

## 2016-06-29 DIAGNOSIS — Z1212 Encounter for screening for malignant neoplasm of rectum: Secondary | ICD-10-CM

## 2016-06-29 HISTORY — DX: Excessive and frequent menstruation with irregular cycle: N92.1

## 2016-06-29 HISTORY — DX: Dysmenorrhea, unspecified: N94.6

## 2016-06-29 LAB — HEMOCCULT GUIAC POC 1CARD (OFFICE): FECAL OCCULT BLD: NEGATIVE

## 2016-06-29 MED ORDER — NORETHIN-ETH ESTRAD-FE BIPHAS 1 MG-10 MCG / 10 MCG PO TABS: 1.0000 | ORAL_TABLET | Freq: Every day | ORAL | 4 refills | Status: DC

## 2016-06-29 MED ORDER — ESCITALOPRAM OXALATE 20 MG PO TABS: 20.0000 mg | ORAL_TABLET | Freq: Every day | ORAL | 3 refills | Status: DC

## 2016-06-29 NOTE — Progress Notes (Signed)
Patient ID: Jamie Wang, female   DOB: 02/06/1976, 41 y.o.   MRN: [id number] History of Present Illness: Jamie BradfordKimberly is a 41 year old white female, married in for a well woman gyn exam and pap She [facility] 4 th [facility] [employer].  No current PCP.    Current Medications, Allergies, Past Medical History, Past Surgical History, Family History and Social History were reviewed in [facility] electronic medical record.     Review of Systems: Patient denies any headaches, hearing loss, fatigue, blurred vision, shortness of breath, chest pain, abdominal pain, problems with bowel movements, urination, or intercourse. No mood swings,but some PMS.+heavy period and cramps and low back pain.Has stress at work and has occasional flutter in chest, no pain. Has pain in knees and sees Dr Eulah PontMurphy, and has sprain in ankle too.    Physical Exam:BP 130/80 (BP Location: Left Arm, Patient Position: Sitting, Cuff Size: Large)   Pulse 82   Ht 5\' 4"  (1.626 m)   Wt 260 lb (117.9 kg)   LMP 06/24/2016 (Exact Date)   Breastfeeding? No   BMI 44.63 kg/m  General:  Well developed, well nourished, no acute distress Skin:  Warm and dry Neck:  Midline trachea, normal thyroid, good ROM, no lymphadenopathy Lungs; Clear to auscultation bilaterally Breast:  No dominant palpable mass, retraction, or nipple discharge Cardiovascular: Regular rate and rhythm Abdomen:  Soft, non tender, no hepatosplenomegaly Pelvic:  External genitalia is normal in appearance, no lesions.  The vagina is normal in appearance. Urethra has no lesions or masses. The cervix is smooth,pap with HPV performed.  Uterus is felt to be normal size, shape, and contour.  No adnexal masses or tenderness noted.Bladder is non tender, no masses felt. Rectal: Good sphincter tone, no polyps, or hemorrhoids felt.  Hemoccult negative. Extremities/musculoskeletal:  No swelling or varicosities noted, no clubbing or cyanosis Psych:  No mood changes, alert and  cooperative,seems happy PHQ 2 score 0.  Impression: 1. Encounter for gynecological examination with Papanicolaou smear of cervix   2. Menorrhagia with irregular cycle   3. Dysmenorrhea   4. Screening for colorectal cancer   5. Screening for diabetes mellitus   6. Anxiety   7. Encounter for menstrual regulation       Plan: Check CBC,CMP,TSH and lipids,A1c and vitamin D Refilled lexapro 20 mg #90 take 1 daily with 3 refills Rx lo loestrin disp 3 packs with 4 refills, 3 packs given lot [id number] A exp 8/19, start today. Follow up in 3 months Physical in 1 year, pap in 3 if normal Mammogram now and yearly

## 2016-07-03 LAB — CYTOLOGY - PAP
DIAGNOSIS: UNDETERMINED — AB
HPV: NOT DETECTED

## 2016-07-14 ENCOUNTER — Telehealth: Payer: Self-pay | Admitting: Adult Health

## 2016-07-14 ENCOUNTER — Encounter: Payer: Self-pay | Admitting: Adult Health

## 2016-07-14 MED ORDER — AZITHROMYCIN 250 MG PO TABS: ORAL_TABLET | ORAL | 0 refills | Status: DC

## 2016-07-14 NOTE — Telephone Encounter (Signed)
Pt complains of sus pressure and green mucous,OTC not helping, will Rx Zpack

## 2016-07-15 LAB — CBC
Hematocrit: 35.6 % (ref 34.0–46.6)
Hemoglobin: 12.2 g/dL (ref 11.1–15.9)
MCH: 31 pg (ref 26.6–33.0)
MCHC: 34.3 g/dL (ref 31.5–35.7)
MCV: 90 fL (ref 79–97)
PLATELETS: 215 10*3/uL (ref 150–379)
RBC: 3.94 x10E6/uL (ref 3.77–5.28)
RDW: 13.2 % (ref 12.3–15.4)
WBC: 7.9 10*3/uL (ref 3.4–10.8)

## 2016-07-15 LAB — TSH: TSH: 1.57 u[IU]/mL (ref 0.450–4.500)

## 2016-07-15 LAB — COMPREHENSIVE METABOLIC PANEL
ALK PHOS: 47 IU/L (ref 39–117)
ALT: 10 IU/L (ref 0–32)
AST: 12 IU/L (ref 0–40)
Albumin/Globulin Ratio: 1.6 (ref 1.2–2.2)
Albumin: 3.9 g/dL (ref 3.5–5.5)
BUN/Creatinine Ratio: 10 (ref 9–23)
BUN: 8 mg/dL (ref 6–24)
Bilirubin Total: 0.4 mg/dL (ref 0.0–1.2)
CO2: 24 mmol/L (ref 18–29)
CREATININE: 0.84 mg/dL (ref 0.57–1.00)
Calcium: 8.6 mg/dL — ABNORMAL LOW (ref 8.7–10.2)
Chloride: 101 mmol/L (ref 96–106)
GFR calc Af Amer: 101 mL/min/{1.73_m2} (ref >59)
GFR calc non Af Amer: 87 mL/min/{1.73_m2} (ref >59)
GLUCOSE: 93 mg/dL (ref 65–99)
Globulin, Total: 2.5 g/dL (ref 1.5–4.5)
Potassium: 4.4 mmol/L (ref 3.5–5.2)
Sodium: 139 mmol/L (ref 134–144)
Total Protein: 6.4 g/dL (ref 6.0–8.5)

## 2016-07-15 LAB — LIPID PANEL
CHOLESTEROL TOTAL: 189 mg/dL (ref 100–199)
Chol/HDL Ratio: 3.5 ratio units (ref 0.0–4.4)
HDL: 54 mg/dL (ref >39)
LDL Calculated: 111 mg/dL — ABNORMAL HIGH (ref 0–99)
Triglycerides: 118 mg/dL (ref 0–149)
VLDL CHOLESTEROL CAL: 24 mg/dL (ref 5–40)

## 2016-07-15 LAB — VITAMIN D 25 HYDROXY (VIT D DEFICIENCY, FRACTURES): Vit D, 25-Hydroxy: 16.9 ng/mL — ABNORMAL LOW (ref 30.0–100.0)

## 2016-07-15 LAB — HEMOGLOBIN A1C
ESTIMATED AVERAGE GLUCOSE: 94 mg/dL
Hgb A1c MFr Bld: 4.9 % (ref 4.8–5.6)

## 2016-07-19 ENCOUNTER — Telehealth: Payer: Self-pay | Admitting: Adult Health

## 2016-07-19 ENCOUNTER — Encounter: Payer: Self-pay | Admitting: Adult Health

## 2016-07-19 DIAGNOSIS — E559 Vitamin D deficiency, unspecified: Secondary | ICD-10-CM

## 2016-07-19 HISTORY — DX: Vitamin D deficiency, unspecified: E55.9

## 2016-07-19 MED ORDER — CHOLECALCIFEROL 125 MCG (5000 UT) PO CAPS: 5000.0000 [IU] | ORAL_CAPSULE | Freq: Every day | ORAL | Status: DC

## 2016-07-19 NOTE — Telephone Encounter (Signed)
Pt aware mammogram was normal and labs all good except vitamin D def, add vitamin D 3 5000 IU daily.

## 2016-07-26 ENCOUNTER — Other Ambulatory Visit: Payer: Self-pay | Admitting: Adult Health

## 2016-09-28 ENCOUNTER — Telehealth: Payer: Self-pay | Admitting: Adult Health

## 2016-09-28 NOTE — Telephone Encounter (Signed)
Pt is going great on lo loestrin, she is having to work tomorrow, will cancel appt.

## 2016-09-29 ENCOUNTER — Ambulatory Visit: Payer: BC Managed Care – PPO | Admitting: Adult Health

## 2016-12-09 ENCOUNTER — Telehealth: Payer: Self-pay | Admitting: Adult Health

## 2016-12-09 MED ORDER — AZITHROMYCIN 250 MG PO TABS: ORAL_TABLET | ORAL | 0 refills | Status: DC

## 2016-12-09 NOTE — Telephone Encounter (Signed)
Pt called has thick yellow mucous and congestion, ?sinus infection, was at beach last week not as bad, worse now and school starts next week, will rx Z pack, if not better, make appt.

## 2017-03-04 ENCOUNTER — Telehealth: Payer: Self-pay | Admitting: Adult Health

## 2017-03-04 MED ORDER — AZITHROMYCIN 250 MG PO TABS: ORAL_TABLET | ORAL | 0 refills | Status: DC

## 2017-03-04 NOTE — Telephone Encounter (Signed)
Pt complaining of sinus infection, Has used Z pack in past, will rx Zpack,[facility] [place]

## 2017-06-17 ENCOUNTER — Telehealth: Payer: Self-pay | Admitting: *Deleted

## 2017-06-17 MED ORDER — AZITHROMYCIN 250 MG PO TABS: ORAL_TABLET | ORAL | 0 refills | Status: DC

## 2017-06-17 NOTE — Telephone Encounter (Signed)
Pt called with drainage, cough, sinus pressure. Feels like she has a sinus infection. No fever. Requesting a Z pack. Pt called Jenn and Jenn advised to call for [employer]. Please advise. Thanks!! JSY

## 2017-06-17 NOTE — Telephone Encounter (Signed)
Pt called w/ following complaints per nurse:   Pt called with drainage, cough, sinus pressure. Feels like she has a sinus infection. No fever. Requesting a Z pack. Please advise. Thanks!! JSY  Rx zpak, call if not improving Cheral MarkerKimberly R. Jennessa Trigo, CNM, [facility]NP-BC 06/17/2017 11:26 AM

## 2017-07-28 ENCOUNTER — Other Ambulatory Visit: Payer: Self-pay | Admitting: Adult Health

## 2017-10-18 ENCOUNTER — Other Ambulatory Visit: Payer: Self-pay | Admitting: Adult Health

## 2018-04-06 ENCOUNTER — Telehealth: Payer: Self-pay | Admitting: Adult Health

## 2018-04-06 NOTE — Telephone Encounter (Signed)
Pt called has had congestion and now ears hurt, and noises are muffled, she had a Z pack at home and has started that, get something with decongestant in it, like mucinex D, if not better, will need to be seen

## 2018-06-11 ENCOUNTER — Other Ambulatory Visit: Payer: Self-pay | Admitting: Adult Health

## 2018-10-19 ENCOUNTER — Other Ambulatory Visit: Payer: Self-pay | Admitting: Adult Health

## 2018-11-02 ENCOUNTER — Telehealth: Payer: Self-pay | Admitting: Adult Health

## 2018-11-02 MED ORDER — HYDROCHLOROTHIAZIDE 25 MG PO TABS: ORAL_TABLET | ORAL | 1 refills | Status: DC

## 2018-11-02 NOTE — Telephone Encounter (Signed)
Has had swelling in feet and legs and some itching, had some hydrodiuril left from postpartum and took them and it helped, will refill them, for prn use

## 2018-11-24 ENCOUNTER — Other Ambulatory Visit: Payer: Self-pay | Admitting: Adult Health

## 2018-12-27 ENCOUNTER — Other Ambulatory Visit: Payer: Self-pay | Admitting: Women's Health

## 2019-01-26 ENCOUNTER — Other Ambulatory Visit: Payer: Self-pay | Admitting: Women's Health

## 2019-05-16 ENCOUNTER — Other Ambulatory Visit: Payer: Self-pay | Admitting: Adult Health

## 2019-05-21 ENCOUNTER — Ambulatory Visit: Payer: BC Managed Care – PPO | Attending: Internal Medicine

## 2019-05-21 ENCOUNTER — Other Ambulatory Visit: Payer: Self-pay

## 2019-05-21 DIAGNOSIS — Z20822 Contact with and (suspected) exposure to covid-19: Secondary | ICD-10-CM

## 2019-05-22 ENCOUNTER — Telehealth: Payer: Self-pay | Admitting: Adult Health

## 2019-05-22 DIAGNOSIS — Z01419 Encounter for gynecological examination (general) (routine) without abnormal findings: Secondary | ICD-10-CM

## 2019-05-22 DIAGNOSIS — Z131 Encounter for screening for diabetes mellitus: Secondary | ICD-10-CM

## 2019-05-22 DIAGNOSIS — E559 Vitamin D deficiency, unspecified: Secondary | ICD-10-CM

## 2019-05-22 NOTE — Telephone Encounter (Signed)
She wants labs 2/26. Will place orders and needs appt for pap and physical

## 2019-05-23 LAB — NOVEL CORONAVIRUS, NAA: SARS-CoV-2, NAA: NOT DETECTED

## 2019-06-24 ENCOUNTER — Ambulatory Visit: Payer: BC Managed Care – PPO | Attending: Internal Medicine

## 2019-06-24 DIAGNOSIS — Z23 Encounter for immunization: Secondary | ICD-10-CM | POA: Insufficient documentation

## 2019-06-24 NOTE — Progress Notes (Signed)
   Covid-19 Vaccination Clinic  Name:  Jamie Wang    MRN: [id number] DOB: 01/24/1976  06/24/2019  Jamie Wang was observed post Covid-19 immunization for 15 minutes without incident. She was provided with Vaccine Information Sheet and instruction to access the V-Safe system.   Jamie Wang was instructed to call 911 with any severe reactions post vaccine: Marland Kitchen Difficulty breathing  . Swelling of face and throat  . A fast heartbeat  . A bad rash all over body  . Dizziness and weakness   Immunizations Administered    Name Date Dose VIS Date Route   Pfizer COVID-19 Vaccine 06/24/2019  2:24 PM 0.3 mL 03/30/2019 Intramuscular   Manufacturer: [employer], [employer]   Lot: [id number]   NDC: [address]-0

## 2019-07-15 ENCOUNTER — Ambulatory Visit: Payer: BC Managed Care – PPO | Attending: Internal Medicine

## 2019-07-15 DIAGNOSIS — Z23 Encounter for immunization: Secondary | ICD-10-CM

## 2019-07-15 NOTE — Progress Notes (Signed)
   Covid-19 Vaccination Clinic  Name:  Jamie Wang    MRN: [id number] DOB: [birth year]  07/15/2019  Jamie Wang was observed post Covid-19 immunization for 15 minutes without incident. She was provided with Vaccine Information Sheet and instruction to access the V-Safe system.   Jamie Wang was instructed to call 911 with any severe reactions post vaccine: Marland Kitchen Difficulty breathing  . Swelling of face and throat  . A fast heartbeat  . A bad rash all over body  . Dizziness and weakness   Immunizations Administered    Name Date Dose VIS Date Route   Pfizer COVID-19 Vaccine 07/15/2019  1:21 PM 0.3 mL 03/30/2019 Intramuscular   Manufacturer: [employer], [employer]   Lot: [id number]   NDC: [address]-3

## 2019-07-27 LAB — COMPREHENSIVE METABOLIC PANEL
ALT: 14 IU/L (ref 0–32)
AST: 23 IU/L (ref 0–40)
Albumin/Globulin Ratio: 1.2 (ref 1.2–2.2)
Albumin: 3.7 g/dL — ABNORMAL LOW (ref 3.8–4.8)
Alkaline Phosphatase: 46 IU/L (ref 39–117)
BUN/Creatinine Ratio: 9 (ref 9–23)
BUN: 9 mg/dL (ref 6–24)
Bilirubin Total: 0.6 mg/dL (ref 0.0–1.2)
CO2: 24 mmol/L (ref 20–29)
Calcium: 8.9 mg/dL (ref 8.7–10.2)
Chloride: 100 mmol/L (ref 96–106)
Creatinine, Ser: 1.02 mg/dL — ABNORMAL HIGH (ref 0.57–1.00)
GFR calc Af Amer: 78 mL/min/{1.73_m2} (ref >59)
GFR calc non Af Amer: 68 mL/min/{1.73_m2} (ref >59)
Globulin, Total: 3.1 g/dL (ref 1.5–4.5)
Glucose: 102 mg/dL — ABNORMAL HIGH (ref 65–99)
Potassium: 4.6 mmol/L (ref 3.5–5.2)
Sodium: 135 mmol/L (ref 134–144)
Total Protein: 6.8 g/dL (ref 6.0–8.5)

## 2019-07-27 LAB — VITAMIN D 25 HYDROXY (VIT D DEFICIENCY, FRACTURES): Vit D, 25-Hydroxy: 24.8 ng/mL — ABNORMAL LOW (ref 30.0–100.0)

## 2019-07-27 LAB — CBC
Hematocrit: 39.3 % (ref 34.0–46.6)
Hemoglobin: 13.7 g/dL (ref 11.1–15.9)
MCH: 31.9 pg (ref 26.6–33.0)
MCHC: 34.9 g/dL (ref 31.5–35.7)
MCV: 92 fL (ref 79–97)
Platelets: 243 10*3/uL (ref 150–450)
RBC: 4.29 x10E6/uL (ref 3.77–5.28)
RDW: 12.4 % (ref 11.7–15.4)
WBC: 6.8 10*3/uL (ref 3.4–10.8)

## 2019-07-27 LAB — LIPID PANEL
Chol/HDL Ratio: 4.3 ratio (ref 0.0–4.4)
Cholesterol, Total: 205 mg/dL — ABNORMAL HIGH (ref 100–199)
HDL: 48 mg/dL (ref >39)
LDL Chol Calc (NIH): 128 mg/dL — ABNORMAL HIGH (ref 0–99)
Triglycerides: 162 mg/dL — ABNORMAL HIGH (ref 0–149)
VLDL Cholesterol Cal: 29 mg/dL (ref 5–40)

## 2019-07-27 LAB — HEMOGLOBIN A1C
Est. average glucose Bld gHb Est-mCnc: 97 mg/dL
Hgb A1c MFr Bld: 5 % (ref 4.8–5.6)

## 2019-07-27 LAB — TSH: TSH: 1.34 u[IU]/mL (ref 0.450–4.500)

## 2019-10-14 ENCOUNTER — Other Ambulatory Visit: Payer: Self-pay | Admitting: Adult Health

## 2019-11-14 ENCOUNTER — Encounter: Payer: Self-pay | Admitting: Adult Health

## 2019-12-06 ENCOUNTER — Telehealth: Payer: Self-pay | Admitting: Adult Health

## 2019-12-06 MED ORDER — AMPICILLIN 500 MG PO CAPS: 500.0000 mg | ORAL_CAPSULE | Freq: Three times a day (TID) | ORAL | 0 refills | Status: DC

## 2019-12-06 NOTE — Telephone Encounter (Signed)
Pt has sore throat, daughter +strep, son has had croup, with hospitalization, will rx ampicillin.

## 2019-12-20 ENCOUNTER — Other Ambulatory Visit: Payer: BC Managed Care – PPO | Admitting: Adult Health

## 2020-02-15 ENCOUNTER — Ambulatory Visit (INDEPENDENT_AMBULATORY_CARE_PROVIDER_SITE_OTHER): Payer: BC Managed Care – PPO | Admitting: Obstetrics & Gynecology

## 2020-02-15 ENCOUNTER — Encounter: Payer: Self-pay | Admitting: Obstetrics & Gynecology

## 2020-02-15 ENCOUNTER — Other Ambulatory Visit: Payer: Self-pay

## 2020-02-15 VITALS — BP 133/84 | HR 87 | Ht 64.0 in | Wt 251.5 lb

## 2020-02-15 DIAGNOSIS — A6004 Herpesviral vulvovaginitis: Secondary | ICD-10-CM

## 2020-02-15 MED ORDER — VALACYCLOVIR HCL 500 MG PO TABS: 500.0000 mg | ORAL_TABLET | Freq: Every day | ORAL | 11 refills | Status: DC

## 2020-02-15 MED ORDER — VALACYCLOVIR HCL 1 G PO TABS: 1000.0000 mg | ORAL_TABLET | Freq: Two times a day (BID) | ORAL | 2 refills | Status: DC

## 2020-02-15 NOTE — Addendum Note (Signed)
Addended by: Colen Darling on: 02/15/2020 11:45 AM   Modules accepted: Orders

## 2020-02-15 NOTE — Progress Notes (Signed)
Chief Complaint  Patient presents with  . blisters in vaginal area      44 y.o. [id number] Patient's last menstrual period was 02/08/2020 (approximate). The current method of family planning is tubal ligation.  Outpatient Encounter Medications as of 02/15/2020  Medication Sig  . Cholecalciferol 5000 units capsule Take 1 capsule (5,000 Units total) by mouth daily.  Marland Kitchen escitalopram (LEXAPRO) 20 MG tablet TAKE 1 TABLET (20 MG TOTAL) BY MOUTH DAILY.  . hydrochlorothiazide (HYDRODIURIL) 25 MG tablet TAKE 1 TABLET DAILY AS NEEDED FOR SWELLING IN FEET AND LEGS  . LO LOESTRIN FE 1 MG-10 MCG / 10 MCG tablet TAKE 1 TABLET BY MOUTH DAILY  . Naproxen Sodium (ALEVE PO) Take by mouth as needed.  . valACYclovir (VALTREX) 1000 MG tablet Take 1 tablet (1,000 mg total) by mouth 2 (two) times daily.  . [DISCONTINUED] acetaminophen (TYLENOL) 500 MG tablet Take 1,000 mg by mouth every 6 (six) hours as needed for mild pain or headache.  . [DISCONTINUED] ampicillin (PRINCIPEN) 500 MG capsule Take 1 capsule (500 mg total) by mouth 3 (three) times daily.  . [DISCONTINUED] azithromycin (ZITHROMAX Z-PAK) 250 MG tablet Use as directed  . [DISCONTINUED] cromolyn (NASALCROM) 5.2 MG/ACT nasal spray Place 1 spray into both nostrils daily as needed.   . [DISCONTINUED] ibuprofen (ADVIL,MOTRIN) 200 MG tablet Take 400 mg by mouth as needed.  . [DISCONTINUED] loratadine (CLARITIN) 10 MG tablet Take 10 mg by mouth daily.  . [DISCONTINUED] PROAIR HFA 108 (90 Base) MCG/ACT inhaler Inhale 2 puffs into the lungs as needed.    No facility-administered encounter medications on file as of 02/15/2020.    Subjective Pt with itching, burning symptoms for about a week Intensified 4 days ago On fire when urinates And lesions have appeared as well Past Medical History:  Diagnosis Date  . AMA (advanced maternal age) multigravida 35+ 06/04/2014  . Anxiety   . Arthritis    both knees  . Asthma   . Contraceptive management  05/29/2013  . Foot sprain    mid foot sprain  . GERD (gastroesophageal reflux disease)    takes zantac   . Multiple allergies   . Obesity   . Plantar fasciitis, bilateral   . Supervision of normal pregnancy in first trimester 06/04/2014  . Vitamin D deficiency 07/19/2016    Past Surgical History:  Procedure Laterality Date  . carpel tunnel release  2005   bilateral  . CESAREAN SECTION  04/13/2012   Procedure: CESAREAN SECTION;  Surgeon: Tilda Burrow, MD;  Location: [facility];  Service: Obstetrics;  Laterality: N/A;  . CESAREAN SECTION WITH BILATERAL TUBAL LIGATION Bilateral 12/24/2014   Procedure: CESAREAN SECTION WITH BILATERAL TUBAL LIGATION, wide excision of cicatrix;  Surgeon: Tilda Burrow, MD;  Location: [facility];  Service: Obstetrics;  Laterality: Bilateral;  Combined Spinal and Epidural  . WISDOM TOOTH EXTRACTION      OB History    Gravida  2   Para  2   Term  1   Preterm  1   AB      Living  2     SAB      TAB      Ectopic      Multiple  0   Live Births  2        Obstetric Comments  Decreased fetal movement and heart rate drops due to placental insuffiencey.         Allergies  Allergen Reactions  .  Bactrim [Sulfamethoxazole-Trimethoprim] Swelling  . Doxycycline Hives    Social History   Socioeconomic History  . Marital status: Married    Spouse name: Not on file  . Number of children: 1  . Years of education: Not on file  . Highest education level: Not on file  Occupational History  . Occupation: Runner, broadcasting/film/video  Tobacco Use  . Smoking status: Never Smoker  . Smokeless tobacco: Never Used  Vaping Use  . Vaping Use: Never used  Substance and Sexual Activity  . Alcohol use: No  . Drug use: No  . Sexual activity: Yes    Birth control/protection: Surgical, Pill    Comment: tubal  Other Topics Concern  . Not on file  Social History Narrative  . Not on file   Social Determinants of Health   Financial Resource Strain:   . Difficulty of  Paying Living Expenses: Not on file  Food Insecurity:   . Worried About Programme researcher, broadcasting/film/video in the Last Year: Not on file  . Ran Out of Food in the Last Year: Not on file  Transportation Needs:   . Lack of Transportation (Medical): Not on file  . Lack of Transportation (Non-Medical): Not on file  Physical Activity:   . Days of Exercise per Week: Not on file  . Minutes of Exercise per Session: Not on file  Stress:   . Feeling of Stress : Not on file  Social Connections:   . Frequency of Communication with Friends and Family: Not on file  . Frequency of Social Gatherings with Friends and Family: Not on file  . Attends Religious Services: Not on file  . Active Member of Clubs or Organizations: Not on file  . Attends Banker Meetings: Not on file  . Marital Status: Not on file    Family History  Problem Relation Age of Onset  . Stroke Maternal Grandmother   . Heart disease Father   . Hypertension Mother   . Heart attack Paternal Grandfather   . Hypertension Paternal Grandmother   . Other Paternal Grandmother        eye issues  . Congestive Heart Failure Maternal Grandfather     Medications:       Current Outpatient Medications:  .  Cholecalciferol 5000 units capsule, Take 1 capsule (5,000 Units total) by mouth daily., Disp: , Rfl:  .  escitalopram (LEXAPRO) 20 MG tablet, TAKE 1 TABLET (20 MG TOTAL) BY MOUTH DAILY., Disp: 90 tablet, Rfl: 3 .  hydrochlorothiazide (HYDRODIURIL) 25 MG tablet, TAKE 1 TABLET DAILY AS NEEDED FOR SWELLING IN FEET AND LEGS, Disp: 30 tablet, Rfl: 1 .  LO LOESTRIN FE 1 MG-10 MCG / 10 MCG tablet, TAKE 1 TABLET BY MOUTH DAILY, Disp: 84 tablet, Rfl: 3 .  Naproxen Sodium (ALEVE PO), Take by mouth as needed., Disp: , Rfl:  .  valACYclovir (VALTREX) 1000 MG tablet, Take 1 tablet (1,000 mg total) by mouth 2 (two) times daily., Disp: 20 tablet, Rfl: 2  Objective Blood pressure 133/84, pulse 87, height 5\' 4"  (1.626 m), weight 251 lb 8 oz (114.1 kg),  last menstrual period 02/08/2020.  Ulcerative lesions of the left vulva with enlarged lymph nodes palpated  Pertinent ROS No burning with urination, frequency or urgency No nausea, vomiting or diarrhea Nor fever chills or other constitutional symptoms   Labs or studies HSV culture is done    Impression Diagnoses this Encounter::   ICD-10-CM   1. Herpes simplex vulvovaginitis, initial outbreak  A60.04  valtex 1 gram twice a day for 10 days    Established relevant diagnosis(es):   Plan/Recommendations: Meds ordered this encounter  Medications  . valACYclovir (VALTREX) 1000 MG tablet    Sig: Take 1 tablet (1,000 mg total) by mouth 2 (two) times daily.    Dispense:  20 tablet    Refill:  2    Labs or Scans Ordered: No orders of the defined types were placed in this encounter.   Management:: As above  Follow up No follow-ups on file.       All questions were answered.

## 2020-02-18 ENCOUNTER — Ambulatory Visit: Payer: BC Managed Care – PPO | Admitting: Obstetrics & Gynecology

## 2020-02-18 LAB — HERPES SIMPLEX VIRUS CULTURE

## 2020-04-22 ENCOUNTER — Other Ambulatory Visit: Payer: Self-pay | Admitting: Adult Health

## 2020-10-20 ENCOUNTER — Other Ambulatory Visit: Payer: Self-pay | Admitting: Adult Health

## 2020-12-01 ENCOUNTER — Encounter: Payer: Self-pay | Admitting: Adult Health

## 2020-12-30 ENCOUNTER — Encounter: Payer: Self-pay | Admitting: Adult Health

## 2020-12-30 ENCOUNTER — Ambulatory Visit (INDEPENDENT_AMBULATORY_CARE_PROVIDER_SITE_OTHER): Payer: BC Managed Care – PPO | Admitting: Adult Health

## 2020-12-30 ENCOUNTER — Other Ambulatory Visit: Payer: Self-pay

## 2020-12-30 ENCOUNTER — Other Ambulatory Visit (HOSPITAL_COMMUNITY)
Admission: RE | Admit: 2020-12-30 | Discharge: 2020-12-30 | Disposition: A | Discharge status: Home / Self Care | Payer: BC Managed Care – PPO | Source: Ambulatory Visit | Attending: Adult Health | Admitting: Adult Health

## 2020-12-30 VITALS — BP 124/85 | HR 82 | Ht 63.75 in | Wt 247.0 lb

## 2020-12-30 DIAGNOSIS — Z7689 Persons encountering health services in other specified circumstances: Secondary | ICD-10-CM

## 2020-12-30 DIAGNOSIS — F419 Anxiety disorder, unspecified: Secondary | ICD-10-CM

## 2020-12-30 DIAGNOSIS — Z01419 Encounter for gynecological examination (general) (routine) without abnormal findings: Secondary | ICD-10-CM | POA: Insufficient documentation

## 2020-12-30 DIAGNOSIS — Z1211 Encounter for screening for malignant neoplasm of colon: Secondary | ICD-10-CM | POA: Diagnosis not present

## 2020-12-30 LAB — HEMOCCULT GUIAC POC 1CARD (OFFICE): Fecal Occult Blood, POC: NEGATIVE

## 2020-12-30 NOTE — Progress Notes (Signed)
Patient ID: Jamie Wang, female   DOB: 1975/11/20, 45 y.o.   MRN: [id number] History of Present Illness: Jamie Wang is a 45 year old white female,married, G2P2 in for a well woman gyn exam and pap. 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 No current PCP.   Current Medications, Allergies, Past Medical History, Past Surgical History, Family History and Social History were reviewed in [employer] record.     Review of Systems:  Patient denies any headaches, hearing loss, fatigue, blurred vision, shortness of breath, chest pain, abdominal pain, problems with bowel movements, urination, or intercourse. No  mood swings.  Has pain in both knees and has had gel injections in the left. She has been diagnosed with sleep apnea and started  CPAP. No periods on OCs   Physical Exam:BP 124/85 (BP Location: Left Arm, Patient Position: Sitting, Cuff Size: Large)   Pulse 82   Ht 5' 3.75" (1.619 m)   Wt 247 lb (112 kg)   LMP  (LMP Unknown)   BMI 42.73 kg/m   General:  Well developed, well nourished, no acute distress Skin:  Warm and dry Neck:  Midline trachea, normal thyroid, good ROM, no lymphadenopathy Lungs; Clear to auscultation bilaterally Breast:  No dominant palpable mass, retraction, or nipple discharge Cardiovascular: Regular rate and rhythm Abdomen:  Soft, non tender, no hepatosplenomegaly Pelvic:  External genitalia is normal in appearance, no lesions.  The vagina is normal in appearance. Urethra has no lesions or masses. The cervix is smooth, pap with HR HPV genotyping performed.  Uterus is felt to be normal size, shape, and contour.  No adnexal masses or tenderness noted.Bladder is non tender, no masses felt. Rectal: Good sphincter tone, no polyps, or hemorrhoids felt.  Hemoccult negative. Extremities/musculoskeletal:  No swelling or varicosities noted, no clubbing or cyanosis Psych:  No mood changes, alert and cooperative,seems happy AA is 0 Fall risk is  low Depression screen [facility] 2/9 12/30/2020 06/29/2016  Decreased Interest 0 0  Down, Depressed, Hopeless 0 0  PHQ - 2 Score 0 0  Altered sleeping 0 -  Tired, decreased energy 0 -  Change in appetite 0 -  Feeling bad or failure about yourself  0 -  Trouble concentrating 0 -  Moving slowly or fidgety/restless 0 -  Suicidal thoughts 0 -  PHQ-9 Score 0 -    GAD 7 : Generalized Anxiety Score 12/30/2020  Nervous, Anxious, on Edge 0  Control/stop worrying 0  Worry too much - different things 0  Trouble relaxing 0  Restless 0  Easily annoyed or irritable 1  Afraid - awful might happen 0  Total GAD 7 Score 1    Upstream - 12/30/20 1526       Pregnancy Intention Screening   Does the patient want to become pregnant in the next year? No    Does the patient's partner want to become pregnant in the next year? No    Would the patient like to discuss contraceptive options today? No      Contraception Wrap Up   Current Method Female Sterilization    End Method Female Sterilization    Contraception Counseling Provided No           .Examination chaperoned by Malachy Mood LPN   Impression and Plan: 1. Encounter for gynecological examination with Papanicolaou smear of cervix Pap sent  Physical in 1 year Pap in 3 if normal Mammogram yearly Cologuard or colonoscopy at 45 She had labs  with Glade Nurse PA at [facility] [place].  2. Encounter for screening fecal occult blood testing   3. Encounter for menstrual regulation Continue lo Loestrin has refills  4. Anxiety Continue lexapro, has refills

## 2021-01-05 LAB — CYTOLOGY - PAP
Comment: NEGATIVE
Diagnosis: NEGATIVE
High risk HPV: NEGATIVE

## 2021-02-17 ENCOUNTER — Other Ambulatory Visit: Payer: Self-pay | Admitting: Obstetrics & Gynecology

## 2021-04-13 ENCOUNTER — Other Ambulatory Visit: Payer: Self-pay | Admitting: Adult Health

## 2021-10-16 ENCOUNTER — Other Ambulatory Visit: Payer: Self-pay | Admitting: Adult Health

## 2022-02-12 ENCOUNTER — Other Ambulatory Visit: Payer: Self-pay | Admitting: Adult Health

## 2022-03-05 ENCOUNTER — Telehealth: Payer: Self-pay | Admitting: Adult Health

## 2022-03-05 MED ORDER — FLUCONAZOLE 150 MG PO TABS: ORAL_TABLET | ORAL | 1 refills | Status: DC

## 2022-03-05 NOTE — Telephone Encounter (Signed)
Took Augmentin for sinus infection, has yeast will rx diflucan

## 2022-03-30 ENCOUNTER — Other Ambulatory Visit: Payer: Self-pay | Admitting: Adult Health

## 2022-03-30 MED ORDER — LO LOESTRIN FE 1 MG-10 MCG / 10 MCG PO TABS: 1.0000 | ORAL_TABLET | Freq: Every day | ORAL | 3 refills | Status: DC

## 2022-03-30 NOTE — Progress Notes (Signed)
Refilled lo Loestrin

## 2022-04-07 ENCOUNTER — Ambulatory Visit: Payer: BC Managed Care – PPO | Admitting: Adult Health

## 2022-04-07 ENCOUNTER — Telehealth: Payer: Self-pay | Admitting: Adult Health

## 2022-04-07 MED ORDER — AMOXICILLIN-POT CLAVULANATE 875-125 MG PO TABS: 1.0000 | ORAL_TABLET | Freq: Two times a day (BID) | ORAL | 0 refills | Status: DC

## 2022-04-07 NOTE — Telephone Encounter (Signed)
Has sinus pressure and headache, has cough and mucous, will rx augmentin

## 2022-05-18 ENCOUNTER — Encounter: Payer: Self-pay | Admitting: Adult Health

## 2022-05-18 ENCOUNTER — Ambulatory Visit (INDEPENDENT_AMBULATORY_CARE_PROVIDER_SITE_OTHER): Payer: BC Managed Care – PPO | Admitting: Adult Health

## 2022-05-18 VITALS — BP 143/94 | HR 103 | Ht 63.5 in | Wt 262.5 lb

## 2022-05-18 DIAGNOSIS — Z01419 Encounter for gynecological examination (general) (routine) without abnormal findings: Secondary | ICD-10-CM | POA: Insufficient documentation

## 2022-05-18 DIAGNOSIS — R635 Abnormal weight gain: Secondary | ICD-10-CM | POA: Insufficient documentation

## 2022-05-18 DIAGNOSIS — Z1211 Encounter for screening for malignant neoplasm of colon: Secondary | ICD-10-CM | POA: Diagnosis not present

## 2022-05-18 DIAGNOSIS — F419 Anxiety disorder, unspecified: Secondary | ICD-10-CM | POA: Insufficient documentation

## 2022-05-18 DIAGNOSIS — Z131 Encounter for screening for diabetes mellitus: Secondary | ICD-10-CM

## 2022-05-18 DIAGNOSIS — R61 Generalized hyperhidrosis: Secondary | ICD-10-CM | POA: Insufficient documentation

## 2022-05-18 DIAGNOSIS — Z13220 Encounter for screening for lipoid disorders: Secondary | ICD-10-CM | POA: Insufficient documentation

## 2022-05-18 DIAGNOSIS — F32A Depression, unspecified: Secondary | ICD-10-CM | POA: Insufficient documentation

## 2022-05-18 LAB — HEMOCCULT GUIAC POC 1CARD (OFFICE): Fecal Occult Blood, POC: NEGATIVE

## 2022-05-18 MED ORDER — ESCITALOPRAM OXALATE 20 MG PO TABS: 20.0000 mg | ORAL_TABLET | Freq: Every day | ORAL | 3 refills | Status: DC

## 2022-05-18 NOTE — Progress Notes (Signed)
Patient ID: Jamie Wang, female   DOB: Dec 08, 1975, 47 y.o.   MRN: [id number] History of Present Illness: Jamie Wang is a 47 year old white female, married, G2P1102 in for a well woman gyn exam. She is having night sweats, has gained weight and can't seem to lose, has pain in knees, is tired and blah feeling. She has appt with [facility] Saturday, and appt with Weston Anna tomorrow about knees. Left knee hurts more than right. She stopped lo Loestrin first of December and has not had a period. 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   Current Medications, Allergies, Past Medical History, Past Surgical History, Family History and Social History were reviewed in [employer] record.     Review of Systems: Patient denies any headaches, hearing loss, fatigue, blurred vision, shortness of breath, chest pain, abdominal pain, problems with bowel movements, urination, or intercourse(not active). No mood swings.  See HPI for positives.   Physical Exam:BP (!) 143/94 (BP Location: Right Arm, Patient Position: Sitting, Cuff Size: Normal)   Pulse (!) 103   Ht 5' 3.5" (1.613 m)   Wt 262 lb 8 oz (119.1 kg)   BMI 45.77 kg/m   General:  Well developed, well nourished, no acute distress Skin:  Warm and dry Neck:  Midline trachea, normal thyroid, good ROM, no lymphadenopathy Lungs; Clear to auscultation bilaterally Breast:  No dominant palpable mass, retraction, or nipple discharge Cardiovascular: Regular rate and rhythm Abdomen:  Soft, non tender, no hepatosplenomegaly,obese Pelvic:  External genitalia is normal in appearance, no lesions.  The vagina is normal in appearance. Urethra has no lesions or masses. The cervix is smooth.  Uterus is felt to be normal size, shape, and contour.  No adnexal masses or tenderness noted.Bladder is non tender, no masses felt. Rectal: Good sphincter tone, no polyps, + hemorrhoids felt.  Hemoccult negative. Extremities/musculoskeletal:  No swelling  or varicosities noted, no clubbing or cyanosis Psych:  No mood changes, alert and cooperative,seems happy AA is 0 Fall risk is low    05/18/2022    2:33 PM 12/30/2020    3:23 PM 06/29/2016    3:53 PM  Depression screen PHQ 2/9  Decreased Interest 2 0 0  Down, Depressed, Hopeless 2 0 0  PHQ - 2 Score 4 0 0  Altered sleeping 1 0   Tired, decreased energy 1 0   Change in appetite 3 0   Feeling bad or failure about yourself  0 0   Trouble concentrating 0 0   Moving slowly or fidgety/restless 0 0   Suicidal thoughts 0 0   PHQ-9 Score 9 0        05/18/2022    2:33 PM 12/30/2020    3:23 PM  GAD 7 : Generalized Anxiety Score  Nervous, Anxious, on Edge 0 0  Control/stop worrying 0 0  Worry too much - different things 0 0  Trouble relaxing 0 0  Restless 0 0  Easily annoyed or irritable 2 1  Afraid - awful might happen 0 0  Total GAD 7 Score 2 1    Upstream - 05/18/22 1440       Pregnancy Intention Screening   Does the patient want to become pregnant in the next year? No    Does the patient's partner want to become pregnant in the next year? No    Would the patient like to discuss contraceptive options today? No      Contraception Wrap Up   Current Method  Female Sterilization    End Method Female Sterilization    Contraception Counseling Provided No                Impression and plan: 1. Encounter for well woman exam with routine gynecological exam Pap and physical in 1 year Will check labs  - CBC - Comprehensive metabolic panel - TSH - Lipid panel  2. Encounter for screening fecal occult blood testing Hemoccult was negative - POCT occult blood stool  3. Night sweats Will check FSH to see if menopausal  - Follicle stimulating hormone  4. Weight gain Has appt with [facility] Saturday  - TSH  5. Screening for diabetes mellitus - Hemoglobin A1c  6. Screening cholesterol level - Lipid panel  7. Anxiety and depression On lexapro will continue Meds  ordered this encounter  Medications   escitalopram (LEXAPRO) 20 MG tablet    Sig: Take 1 tablet (20 mg total) by mouth daily.    Dispense:  90 tablet    Refill:  3    Order Specific Question:   Supervising Provider    Answer:   Elonda Husky, LUTHER H [[id number]]     8. Screening for colorectal cancer Will order cologuard, she is aware if + will need colonoscopy  - Cologuard

## 2022-05-19 LAB — COMPREHENSIVE METABOLIC PANEL
ALT: 43 IU/L — ABNORMAL HIGH (ref 0–32)
AST: 37 IU/L (ref 0–40)
Albumin/Globulin Ratio: 1.6 (ref 1.2–2.2)
Albumin: 4.3 g/dL (ref 3.9–4.9)
Alkaline Phosphatase: 42 IU/L — ABNORMAL LOW (ref 44–121)
BUN/Creatinine Ratio: 13 (ref 9–23)
BUN: 12 mg/dL (ref 6–24)
Bilirubin Total: 0.5 mg/dL (ref 0.0–1.2)
CO2: 23 mmol/L (ref 20–29)
Calcium: 9.3 mg/dL (ref 8.7–10.2)
Chloride: 100 mmol/L (ref 96–106)
Creatinine, Ser: 0.89 mg/dL (ref 0.57–1.00)
Globulin, Total: 2.7 g/dL (ref 1.5–4.5)
Glucose: 97 mg/dL (ref 70–99)
Potassium: 4.7 mmol/L (ref 3.5–5.2)
Sodium: 139 mmol/L (ref 134–144)
Total Protein: 7 g/dL (ref 6.0–8.5)
eGFR: 81 mL/min/{1.73_m2} (ref >59)

## 2022-05-19 LAB — LIPID PANEL
Chol/HDL Ratio: 4.6 ratio — ABNORMAL HIGH (ref 0.0–4.4)
Cholesterol, Total: 216 mg/dL — ABNORMAL HIGH (ref 100–199)
HDL: 47 mg/dL (ref >39)
LDL Chol Calc (NIH): 141 mg/dL — ABNORMAL HIGH (ref 0–99)
Triglycerides: 157 mg/dL — ABNORMAL HIGH (ref 0–149)
VLDL Cholesterol Cal: 28 mg/dL (ref 5–40)

## 2022-05-19 LAB — CBC
Hematocrit: 37.3 % (ref 34.0–46.6)
Hemoglobin: 12.8 g/dL (ref 11.1–15.9)
MCH: 31.1 pg (ref 26.6–33.0)
MCHC: 34.3 g/dL (ref 31.5–35.7)
MCV: 91 fL (ref 79–97)
Platelets: 230 10*3/uL (ref 150–450)
RBC: 4.12 x10E6/uL (ref 3.77–5.28)
RDW: 12.3 % (ref 11.7–15.4)
WBC: 4.6 10*3/uL (ref 3.4–10.8)

## 2022-05-19 LAB — HEMOGLOBIN A1C
Est. average glucose Bld gHb Est-mCnc: 108 mg/dL
Hgb A1c MFr Bld: 5.4 % (ref 4.8–5.6)

## 2022-05-19 LAB — FOLLICLE STIMULATING HORMONE: FSH: 56.3 m[IU]/mL

## 2022-05-19 LAB — TSH: TSH: 1.28 u[IU]/mL (ref 0.450–4.500)

## 2022-05-25 ENCOUNTER — Ambulatory Visit: Payer: BC Managed Care – PPO | Admitting: Adult Health

## 2022-06-15 LAB — COLOGUARD: COLOGUARD: NEGATIVE

## 2023-02-09 ENCOUNTER — Other Ambulatory Visit: Payer: Self-pay | Admitting: Adult Health

## 2023-06-14 ENCOUNTER — Telehealth: Payer: 59

## 2023-06-14 DIAGNOSIS — B9689 Other specified bacterial agents as the cause of diseases classified elsewhere: Secondary | ICD-10-CM

## 2023-06-14 DIAGNOSIS — J019 Acute sinusitis, unspecified: Secondary | ICD-10-CM | POA: Diagnosis not present

## 2023-06-15 MED ORDER — AMOXICILLIN-POT CLAVULANATE 875-125 MG PO TABS: 1.0000 | ORAL_TABLET | Freq: Two times a day (BID) | ORAL | 0 refills | Status: DC

## 2023-06-15 NOTE — Progress Notes (Signed)

## 2023-06-24 ENCOUNTER — Encounter: Payer: Self-pay | Admitting: Family Medicine

## 2023-06-24 ENCOUNTER — Ambulatory Visit (INDEPENDENT_AMBULATORY_CARE_PROVIDER_SITE_OTHER): Payer: 59 | Admitting: Family Medicine

## 2023-06-24 VITALS — BP 130/80 | HR 79 | Temp 98.6°F | Ht 64.0 in | Wt 219.8 lb

## 2023-06-24 DIAGNOSIS — F419 Anxiety disorder, unspecified: Secondary | ICD-10-CM | POA: Diagnosis not present

## 2023-06-24 DIAGNOSIS — Z7689 Persons encountering health services in other specified circumstances: Secondary | ICD-10-CM

## 2023-06-24 DIAGNOSIS — Z6837 Body mass index (BMI) 37.0-37.9, adult: Secondary | ICD-10-CM

## 2023-06-24 DIAGNOSIS — Z13220 Encounter for screening for lipoid disorders: Secondary | ICD-10-CM

## 2023-06-24 DIAGNOSIS — E66812 Obesity, class 2: Secondary | ICD-10-CM

## 2023-06-24 DIAGNOSIS — E66813 Obesity, class 3: Secondary | ICD-10-CM | POA: Insufficient documentation

## 2023-06-24 DIAGNOSIS — Z0000 Encounter for general adult medical examination without abnormal findings: Secondary | ICD-10-CM | POA: Diagnosis not present

## 2023-06-24 DIAGNOSIS — Z1159 Encounter for screening for other viral diseases: Secondary | ICD-10-CM

## 2023-06-24 DIAGNOSIS — Z131 Encounter for screening for diabetes mellitus: Secondary | ICD-10-CM

## 2023-06-24 DIAGNOSIS — F32A Depression, unspecified: Secondary | ICD-10-CM

## 2023-06-24 LAB — COMPREHENSIVE METABOLIC PANEL
ALT: 12 U/L (ref 0–35)
AST: 15 U/L (ref 0–37)
Albumin: 4.3 g/dL (ref 3.5–5.2)
Alkaline Phosphatase: 36 U/L — ABNORMAL LOW (ref 39–117)
BUN: 12 mg/dL (ref 6–23)
CO2: 28 meq/L (ref 19–32)
Calcium: 9.3 mg/dL (ref 8.4–10.5)
Chloride: 102 meq/L (ref 96–112)
Creatinine, Ser: 0.71 mg/dL (ref 0.40–1.20)
GFR: 101.25 mL/min (ref >60.00)
Glucose, Bld: 80 mg/dL (ref 70–99)
Potassium: 4.2 meq/L (ref 3.5–5.1)
Sodium: 138 meq/L (ref 135–145)
Total Bilirubin: 0.6 mg/dL (ref 0.2–1.2)
Total Protein: 7.1 g/dL (ref 6.0–8.3)

## 2023-06-24 LAB — CBC
HCT: 39.1 % (ref 36.0–46.0)
Hemoglobin: 13.2 g/dL (ref 12.0–15.0)
MCHC: 33.7 g/dL (ref 30.0–36.0)
MCV: 95.2 fl (ref 78.0–100.0)
Platelets: 279 10*3/uL (ref 150.0–400.0)
RBC: 4.1 Mil/uL (ref 3.87–5.11)
RDW: 13 % (ref 11.5–15.5)
WBC: 7.9 10*3/uL (ref 4.0–10.5)

## 2023-06-24 LAB — LIPID PANEL
Cholesterol: 176 mg/dL (ref 0–200)
HDL: 56.7 mg/dL (ref >39.00)
LDL Cholesterol: 97 mg/dL (ref 0–99)
NonHDL: 119.09
Total CHOL/HDL Ratio: 3
Triglycerides: 109 mg/dL (ref 0.0–149.0)
VLDL: 21.8 mg/dL (ref 0.0–40.0)

## 2023-06-24 LAB — HEMOGLOBIN A1C: Hgb A1c MFr Bld: 4.8 % (ref 4.6–6.5)

## 2023-06-24 LAB — TSH: TSH: 0.83 u[IU]/mL (ref 0.35–5.50)

## 2023-06-24 NOTE — Progress Notes (Addendum)
 Patient ID: Jamie Wang, female  DOB: Sep 09, 1975, 48 y.o.   MRN: [id number] Patient Care Team    Relationship Specialty Notifications Start End  Natalia Leatherwood, DO PCP - General Family Medicine  06/24/23   [facility]  Optometry  03/20/23   [name], NP Nurse Practitioner Obstetrics and Gynecology  06/24/23   Sheral Apley, MD Attending Physician Orthopedic Surgery  06/24/23     Chief Complaint  Patient presents with   Establish Care    Pt is scheduled for total knee replacement in June; wants to discuss overall health. Possible CPE All meds managed by GYN besides Semaglutide which is managed by [facility] in [place].     Subjective:  Jamie Wang is a 48 y.o.  female present for new patient establishment. All past medical history, surgical history, allergies, family history, immunizations, medications and social history were updated in the electronic medical record today. All recent labs, ED visits and hospitalizations within the last year were reviewed.  Health maintenance:  Colon cancer screen: cologuard completed 06/03/2022, resutls negative. follow up 39yr. Mammogram: completed: 02/23/2023, birads - [facility] on [address]. -GYN Cervical cancer screening: last pap: 12/30/2020, results: WNL/neg co-test 5 yr rpt Immunizations: tdap UTD 12/2014, Influenza declined (encouraged yearly) Infectious disease screening: HIV completed 2016, Hep C completed today DEXA: routine screen at 60 Patient has a Dental home. Hospitalizations/ED visits:reviewed     06/24/2023    1:17 PM 05/18/2022    2:33 PM 12/30/2020    3:23 PM 06/29/2016    3:53 PM  Depression screen PHQ 2/9  Decreased Interest 0 2 0 0  Down, Depressed, Hopeless 0 2 0 0  PHQ - 2 Score 0 4 0 0  Altered sleeping 1 1 0   Tired, decreased energy 1 1 0   Change in appetite 3 3 0   Feeling bad or failure about yourself  0 0 0   Trouble concentrating 0 0 0   Moving slowly or fidgety/restless 0 0 0    Suicidal thoughts 0 0 0   PHQ-9 Score 5 9 0   Difficult doing work/chores Not difficult at all         06/24/2023    1:18 PM 05/18/2022    2:33 PM 12/30/2020    3:23 PM  GAD 7 : Generalized Anxiety Score  Nervous, Anxious, on Edge 0 0 0  Control/stop worrying 0 0 0  Worry too much - different things 0 0 0  Trouble relaxing 0 0 0  Restless 0 0 0  Easily annoyed or irritable 1 2 1   Afraid - awful might happen 0 0 0  Total GAD 7 Score 1 2 1   Anxiety Difficulty Not difficult at all            06/24/2023    1:17 PM 05/18/2022    2:40 PM 12/30/2020    3:25 PM  Fall Risk   Falls in the past year? 0 0 0  Number falls in past yr:  0   Injury with Fall?  0   Follow up Falls evaluation completed      Immunization History  Administered Date(s) Administered   Influenza Split 01/17/2014   PFIZER(Purple Top)SARS-COV-2 Vaccination 06/24/2019, 07/15/2019, 05/01/2020   Tdap 04/14/2012, 12/26/2014    No results found.  Past Medical History:  Diagnosis Date   AMA (advanced maternal age) multigravida 35+ 06/04/2014   Anxiety    Arthritis    both  knees   Asthma    Dysmenorrhea 06/29/2016   Fetal cardiac echogenic focus, antepartum 09/17/2014   Small isolated Lt EICF, all other cardiac structures normal     Foot sprain    mid foot sprain   GERD (gastroesophageal reflux disease)    takes zantac    Menorrhagia with irregular cycle 06/29/2016   Multiple allergies    Obesity    Plantar fasciitis, bilateral    S/P repeat low transverse C-section 12/25/2014   Sleep apnea    UTI (urinary tract infection)    Vitamin D deficiency 07/19/2016   Allergies  Allergen Reactions   Bactrim [Sulfamethoxazole-Trimethoprim] Swelling   Doxycycline Hives   Past Surgical History:  Procedure Laterality Date   carpel tunnel release  2005   bilateral   CESAREAN SECTION  04/13/2012   Procedure: CESAREAN SECTION;  Surgeon: Tilda Burrow, MD;  Location: [facility];  Service: Obstetrics;  Laterality:  N/A;   CESAREAN SECTION WITH BILATERAL TUBAL LIGATION Bilateral 12/24/2014   Procedure: CESAREAN SECTION WITH BILATERAL TUBAL LIGATION, wide excision of cicatrix;  Surgeon: Tilda Burrow, MD;  Location: [facility];  Service: Obstetrics;  Laterality: Bilateral;  Combined Spinal and Epidural   WISDOM TOOTH EXTRACTION     Family History  Problem Relation Age of Onset   Hypertension Mother    Hyperlipidemia Mother    Asthma Mother    Prostate cancer Father    Heart disease Father    Arthritis Father    Hyperlipidemia Father    Hypertension Father    Learning disabilities Brother    Stroke Maternal Grandmother    Arthritis Maternal Grandmother    Congestive Heart Failure Maternal Grandfather    Hypertension Paternal Grandmother    Other Paternal Grandmother        eye issues   Heart attack Paternal Grandfather    Early death Paternal Grandfather    Social History   Social History Narrative   Marital status/children/pets:married, 2 children   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 If you have any questions, ask your nurse or doctor.          STOP taking these medications    amoxicillin-clavulanate 875-125 MG tablet Commonly known as: AUGMENTIN Stopped by: Felix Pacini   Tylenol 325 MG tablet Generic drug: acetaminophen Stopped by: Felix Pacini       TAKE these medications    celecoxib 200 MG capsule Commonly known as: CELEBREX Take 200 mg by mouth 2 (two) times daily.   Cholecalciferol 125 MCG (5000 UT) capsule Take 1 capsule (5,000 Units total) by mouth daily. What changed:  how much to take when to take this   escitalopram 20 MG  tablet Commonly known as: LEXAPRO Take 1 tablet (20 mg total) by mouth daily.   SEMAGLUTIDE (2 MG/DOSE) [place] Inject into the skin. [facility]   valACYclovir 500 MG tablet Commonly known as: VALTREX TAKE 1 TABLET BY MOUTH EVERY DAY   VITAMIN B-12 PO Take by mouth.   ZyrTEC  Allergy 10 MG tablet Generic drug: cetirizine        All past medical history, surgical history, allergies, family history, immunizations andmedications were updated in the EMR today and reviewed under the history and medication portions of their EMR.    No results found for this or any previous visit (from the past 2160 hours).  No results found.   ROS 14 pt review of systems performed and negative (unless mentioned in an HPI)  Objective: BP 130/80   Pulse 79   Temp 98.6 F (37 C)   Ht 5\' 4"  (1.626 m)   Wt 219 lb 12.8 oz (99.7 kg)   LMP 06/03/2023 (Exact Date)   SpO2 99%   BMI 37.73 kg/m  Physical Exam Vitals and nursing note reviewed.  Constitutional:      General: She is not in acute distress.    Appearance: Normal appearance. She is not ill-appearing or toxic-appearing.  HENT:     Head: Normocephalic and atraumatic.     Right Ear: Tympanic membrane, ear canal and external ear normal. There is no impacted cerumen.     Left Ear: Tympanic membrane, ear canal and external ear normal. There is no impacted cerumen.     Nose: No congestion or rhinorrhea.     Mouth/Throat:     Mouth: Mucous membranes are moist.     Pharynx: Oropharynx is clear. No oropharyngeal exudate or posterior oropharyngeal erythema.  Eyes:     General: No scleral icterus.       Right eye: No discharge.        Left eye: No discharge.     Extraocular Movements: Extraocular movements intact.     Conjunctiva/sclera: Conjunctivae normal.     Pupils: Pupils are equal, round, and reactive to light.  Cardiovascular:     Rate and Rhythm: Normal rate and regular rhythm.     Pulses: Normal pulses.     Heart sounds:  Normal heart sounds. No murmur heard.    No friction rub. No gallop.  Pulmonary:     Effort: Pulmonary effort is normal. No respiratory distress.     Breath sounds: Normal breath sounds. No stridor. No wheezing, rhonchi or rales.  Chest:     Chest wall: No tenderness.  Abdominal:     General: Abdomen is flat. Bowel sounds are normal. There is no distension.     Palpations: Abdomen is soft. There is no mass.     Tenderness: There is no abdominal tenderness. There is no right CVA tenderness, left CVA tenderness, guarding or rebound.     Hernia: No hernia is present.  Musculoskeletal:        General: No swelling, tenderness or deformity. Normal range of motion.     Cervical back: Normal range of motion and neck supple. No rigidity or tenderness.     Right lower leg: No edema.     Left lower leg: No edema.  Lymphadenopathy:     Cervical: No cervical adenopathy.  Skin:    General: Skin is warm and dry.     Coloration: Skin is not jaundiced or pale.     Findings: No bruising, erythema, lesion or rash.  Neurological:     General: No focal deficit present.     Mental Status: She is alert and oriented to person, place, and time. Mental status is at baseline.     Cranial Nerves: No cranial nerve deficit.     Sensory: No sensory deficit.     Motor:  No weakness.     Coordination: Coordination normal.     Gait: Gait normal.     Deep Tendon Reflexes: Reflexes normal.  Psychiatric:        Mood and Affect: Mood normal.        Behavior: Behavior normal.        Thought Content: Thought content normal.        Judgment: Judgment normal.      Assessment/plan: Victory Dresden is a 48 y.o. female present for est care /cpe Encounter to establish care Anxiety and depression Managed by [facility] Lipid screening - Lipid panel Diabetes mellitus screening - Hemoglobin A1c Colon cancer screening Cologuard up-to-date Breast cancer screening by mammogram Up-to-date ordered by gynecology Class 2  severe obesity due to excess calories with serious comorbidity and body mass index (BMI) of 37.0 to 37.9 in adult (HCC) - Hemoglobin A1c - Lipid panel - Patient is prescribed semaglutide 2-2.5 mg through [facility]. -She reports she has lost some weight and her first weight loss goal is 200. Encounter for hepatitis C screening test for low risk patient - Hepatitis C Antibody Routine general medical examination at a health care facility (Primary) - CBC - Comprehensive metabolic panel - TSH Patient was encouraged to exercise greater than 150 minutes a week. Patient was encouraged to choose a diet filled with fresh fruits and vegetables, and lean meats. AVS provided to patient today for education/recommendation on gender specific health and safety maintenance. Colon cancer screen: cologuard completed 06/03/2022, resutls negative. follow up 48yr. Mammogram: completed: 02/23/2023, [facility] - [facility] on [address]. -GYN Cervical cancer screening: last pap: 12/30/2020, results: WNL/neg co-test 5 yr rpt Immunizations: tdap UTD 12/2014, Influenza declined (encouraged yearly) Infectious disease screening: HIV completed 2016, Hep C completed today DEXA: routine screen at 60  Return in about 1 year (around 06/24/2024) for cpe (20 min).  Orders Placed This Encounter  Procedures   CBC   Comprehensive metabolic panel   Hemoglobin A1c   Lipid panel   TSH   Hepatitis C Antibody   No orders of the defined types were placed in this encounter.  Referral Orders  No referral(s) requested today     Note is dictated utilizing voice recognition software. Although note has been proof read prior to signing, occasional typographical errors still can be missed. If any questions arise, please do not hesitate to call for verification.  Electronically signed by: Felix Pacini, DO [facility] [place]

## 2023-06-24 NOTE — Patient Instructions (Addendum)
 Return in about 1 year (around 06/24/2024) for cpe (20 min).        Great to see you today.  I have refilled the medication(s) we provide.   If labs were collected or images ordered, we will inform you of  results once we have received them and reviewed. We will contact you either by echart message, or telephone call.  Please give ample time to the testing facility, and our office to run,  receive and review results. Please do not call inquiring of results, even if you can see them in your chart. We will contact you as soon as we are able. If it has been over 1 week since the test was completed, and you have not yet heard from [place], then please call us.    - echart message- for normal results that have been seen by the patient already.   - telephone call: abnormal results or if patient has not viewed results in their echart.  If a referral to a specialist was entered for you, please call us in 2 weeks if you have not heard from the specialist office to schedule.

## 2023-06-25 LAB — HEPATITIS C ANTIBODY: Hepatitis C Ab: NONREACTIVE

## 2023-06-27 ENCOUNTER — Telehealth: Payer: Self-pay

## 2023-06-27 ENCOUNTER — Encounter: Payer: Self-pay | Admitting: Family Medicine

## 2023-06-27 NOTE — Telephone Encounter (Signed)
 Forms faxed

## 2023-06-27 NOTE — Telephone Encounter (Signed)
Completed and returned to [facility] work [employer]

## 2023-06-27 NOTE — Telephone Encounter (Signed)
 Surgical clearance form received by fax. Pt last OV was 06/24/23. Form placed in PCP office for review & signature.

## 2023-07-20 ENCOUNTER — Other Ambulatory Visit: Payer: Self-pay | Admitting: Adult Health

## 2023-10-06 HISTORY — PX: REPLACEMENT TOTAL KNEE: SUR1224

## 2023-12-29 ENCOUNTER — Telehealth: Payer: Self-pay

## 2023-12-29 NOTE — Telephone Encounter (Signed)
 Received Surgical Clearance form by fax. Pt needs an appointment. LVM to discuss/schedule.

## 2024-01-13 ENCOUNTER — Ambulatory Visit: Admitting: Family Medicine

## 2024-01-13 VITALS — BP 128/82 | HR 82 | Temp 98.2°F | Wt 244.0 lb

## 2024-01-13 DIAGNOSIS — G4733 Obstructive sleep apnea (adult) (pediatric): Secondary | ICD-10-CM

## 2024-01-13 DIAGNOSIS — Z23 Encounter for immunization: Secondary | ICD-10-CM | POA: Diagnosis not present

## 2024-01-13 DIAGNOSIS — Z01818 Encounter for other preprocedural examination: Secondary | ICD-10-CM

## 2024-01-13 LAB — CBC WITH DIFFERENTIAL/PLATELET
Basophils Absolute: 0 K/uL (ref 0.0–0.1)
Basophils Relative: 0.6 % (ref 0.0–3.0)
Eosinophils Absolute: 0.2 K/uL (ref 0.0–0.7)
Eosinophils Relative: 3.9 % (ref 0.0–5.0)
HCT: 37.1 % (ref 36.0–46.0)
Hemoglobin: 12.7 g/dL (ref 12.0–15.0)
Lymphocytes Relative: 30.6 % (ref 12.0–46.0)
Lymphs Abs: 1.5 K/uL (ref 0.7–4.0)
MCHC: 34.1 g/dL (ref 30.0–36.0)
MCV: 90.6 fl (ref 78.0–100.0)
Monocytes Absolute: 0.4 K/uL (ref 0.1–1.0)
Monocytes Relative: 8.4 % (ref 3.0–12.0)
Neutro Abs: 2.7 K/uL (ref 1.4–7.7)
Neutrophils Relative %: 56.5 % (ref 43.0–77.0)
Platelets: 219 K/uL (ref 150.0–400.0)
RBC: 4.1 Mil/uL (ref 3.87–5.11)
RDW: 13.5 % (ref 11.5–15.5)
WBC: 4.8 K/uL (ref 4.0–10.5)

## 2024-01-13 LAB — COMPREHENSIVE METABOLIC PANEL WITH GFR
ALT: 17 U/L (ref 0–35)
AST: 28 U/L (ref 0–37)
Albumin: 4.3 g/dL (ref 3.5–5.2)
Alkaline Phosphatase: 39 U/L (ref 39–117)
BUN: 15 mg/dL (ref 6–23)
CO2: 29 meq/L (ref 19–32)
Calcium: 9.2 mg/dL (ref 8.4–10.5)
Chloride: 100 meq/L (ref 96–112)
Creatinine, Ser: 0.81 mg/dL (ref 0.40–1.20)
GFR: 86.1 mL/min (ref >60.00)
Glucose, Bld: 89 mg/dL (ref 70–99)
Potassium: 4 meq/L (ref 3.5–5.1)
Sodium: 135 meq/L (ref 135–145)
Total Bilirubin: 0.7 mg/dL (ref 0.2–1.2)
Total Protein: 6.6 g/dL (ref 6.0–8.3)

## 2024-01-13 LAB — HEMOGLOBIN A1C: Hgb A1c MFr Bld: 5.2 % (ref 4.6–6.5)

## 2024-01-13 MED ORDER — ZEPBOUND 5 MG/0.5ML ~~LOC~~ SOLN: 5.0000 mg | SUBCUTANEOUS | 0 refills | Status: DC

## 2024-01-13 NOTE — Progress Notes (Signed)
 Jamie Wang , Nov 21, 1975, 48 y.o., female MRN: [id number] Patient Care Team    Relationship Specialty Notifications Start End  Catherine Charlies LABOR, DO PCP - General Family Medicine  06/24/23   [facility]  Optometry  03/20/23   Signa Delon LABOR, NP Nurse Practitioner Obstetrics and Gynecology  06/24/23   Beverley Evalene BIRCH, MD Attending Physician Orthopedic Surgery  06/24/23     Chief Complaint  Patient presents with   Surgical Clearance     Subjective: Jamie Wang is a 48 y.o. Pt presents for presurgical clearance.  Tentative surgical date 11/20-[name] Procedure: Right total knee arthroplasty Indication: Osteoarthritis Anesthesia: Spinal  Surgery type risk:   - Intermediate risk= orthopedics Prior anesthesia complications: No history of complications. Family history of prior anesthesia complications: No history of complications.  Cardiac:    - CBD, PAD, stroke, MI, aortic stenosis > denies.    - METs: > 4 METS Pulmonary: obstructive sleep apnea Endocrine: no h/o Obesity:Body mass index is 41.88 kg/m. Chronic kidney disease: no h/o Chronic med that needs to be continued: n/a Anticoagulation:  no  Restarted GLP-1-will need to discontinue 2 weeks prior to surgery.     06/24/2023    1:17 PM 05/18/2022    2:33 PM 12/30/2020    3:23 PM 06/29/2016    3:53 PM  Depression screen PHQ 2/9  Decreased Interest 0 2 0 0  Down, Depressed, Hopeless 0 2 0 0  PHQ - 2 Score 0 4 0 0  Altered sleeping 1 1 0   Tired, decreased energy 1 1 0   Change in appetite 3 3 0   Feeling bad or failure about yourself  0 0 0   Trouble concentrating 0 0 0   Moving slowly or fidgety/restless 0 0 0   Suicidal thoughts 0 0 0   PHQ-9 Score 5 9 0   Difficult doing work/chores Not difficult at all       Allergies  Allergen Reactions   Bactrim [Sulfamethoxazole-Trimethoprim] Swelling   Doxycycline Hives   Social History   Social History Narrative   Marital  status/children/pets:married, 2 children   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, Engineer, agricultural:      -smoke alarm in the home:Yes     - wears seatbelt: Yes     - Feels safe in their relationships: Yes      Past Medical History:  Diagnosis Date   AMA (advanced maternal age) multigravida 35+ 06/04/2014   Anxiety    Arthritis    both knees   Asthma    Dysmenorrhea 06/29/2016   Fetal cardiac echogenic focus, antepartum 09/17/2014   Small isolated Lt EICF, all other cardiac structures normal     Foot sprain    mid foot sprain   GERD (gastroesophageal reflux disease)    takes zantac    Menorrhagia with irregular cycle 06/29/2016   Multiple allergies    Obesity    Plantar fasciitis, bilateral    S/P repeat low transverse C-section 12/25/2014   Sleep apnea    UTI (urinary tract infection)    Vitamin D  deficiency 07/19/2016   Past Surgical History:  Procedure Laterality Date   carpel tunnel release  2005   bilateral   CESAREAN SECTION  04/13/2012   Procedure: CESAREAN SECTION;  Surgeon: Norleen LULLA Server, MD;  Location: [facility];  Service: Obstetrics;  Laterality: N/A;   CESAREAN SECTION WITH BILATERAL TUBAL LIGATION Bilateral 12/24/2014   Procedure: CESAREAN  SECTION WITH BILATERAL TUBAL LIGATION, wide excision of cicatrix;  Surgeon: Norleen Edsel GAILS, MD;  Location: [facility];  Service: Obstetrics;  Laterality: Bilateral;  Combined Spinal and Epidural   WISDOM TOOTH EXTRACTION     Family History  Problem Relation Age of Onset   Hypertension Mother    Hyperlipidemia Mother    Asthma Mother    Prostate cancer Father    Heart disease Father    Arthritis Father    Hyperlipidemia Father    Hypertension Father    Learning disabilities Brother    Stroke Maternal Grandmother    Arthritis Maternal Grandmother    Congestive Heart Failure Maternal Grandfather    Hypertension Paternal Grandmother    Other Paternal Grandmother        eye issues   Heart attack Paternal  Grandfather    Early death Paternal Grandfather    Allergies as of 01/13/2024       Reactions   Bactrim [sulfamethoxazole-trimethoprim] Swelling   Doxycycline Hives        Medication List        Accurate as of January 13, 2024  3:35 PM. If you have any questions, ask your nurse or doctor.          STOP taking these medications    celecoxib 200 MG capsule Commonly known as: CELEBREX Stopped by: Alicia Ackert   Cholecalciferol  125 MCG (5000 UT) capsule Stopped by: Charlies Bellini   SEMAGLUTIDE (2 MG/DOSE) [place] Stopped by: Charlies Bellini   VITAMIN B-12 PO Stopped by: Charlies Bellini       TAKE these medications    Aspirin Low Dose 81 MG chewable tablet Generic drug: aspirin Chew 81 mg by mouth 2 (two) times daily.   Claritin 10 MG tablet Generic drug: loratadine Take 10 mg by mouth daily.   escitalopram  20 MG tablet Commonly known as: LEXAPRO  TAKE 1 TABLET BY MOUTH EVERY DAY   Mapap 500 MG capsule Generic drug: Acetaminophen  SMARTSIG:2 Capsule(s) By Mouth Every 6 Hours PRN   meloxicam  15 MG tablet Commonly known as: MOBIC  Take 15 mg by mouth daily.   methocarbamol 750 MG tablet Commonly known as: ROBAXIN Take 750 mg by mouth every 8 (eight) hours as needed.   oxyCODONE  5 MG immediate release tablet Commonly known as: Oxy IR/ROXICODONE  Take 5 mg by mouth every 4 (four) hours as needed.   phentermine 37.5 MG tablet Commonly known as: ADIPEX-P Take 37.5 mg by mouth every morning.   valACYclovir  500 MG tablet Commonly known as: VALTREX  TAKE 1 TABLET BY MOUTH EVERY DAY   Zepbound  5 MG/0.5ML injection vial Generic drug: tirzepatide  Inject 5 mg into the skin once a week. Started by: Charlies Bellini   ZyrTEC Allergy  10 MG tablet Generic drug: cetirizine        All past medical history, surgical history, allergies, family history, immunizations andmedications were updated in the EMR today and reviewed under the history and medication portions of their  EMR.     ROS Negative, with the exception of above mentioned in HPI   Objective:  BP 128/82   Pulse 82   Temp 98.2 F (36.8 C)   Wt 244 lb (110.7 kg)   SpO2 97%   BMI 41.88 kg/m  Body mass index is 41.88 kg/m.  Physical Exam Vitals and nursing note reviewed.  Constitutional:      General: She is not in acute distress.    Appearance: Normal appearance. She is not ill-appearing, toxic-appearing or diaphoretic.  HENT:     Head: Normocephalic and atraumatic.     Nose: Nose normal.     Mouth/Throat:     Mouth: Mucous membranes are moist.     Pharynx: No oropharyngeal exudate or posterior oropharyngeal erythema.  Eyes:     General: No scleral icterus.       Right eye: No discharge.        Left eye: No discharge.     Extraocular Movements: Extraocular movements intact.     Conjunctiva/sclera: Conjunctivae normal.     Pupils: Pupils are equal, round, and reactive to light.  Cardiovascular:     Rate and Rhythm: Normal rate and regular rhythm.     Heart sounds: No murmur heard. Pulmonary:     Effort: Pulmonary effort is normal. No respiratory distress.     Breath sounds: Normal breath sounds. No wheezing, rhonchi or rales.  Musculoskeletal:     Cervical back: Neck supple.     Right lower leg: No edema.     Left lower leg: No edema.  Lymphadenopathy:     Cervical: No cervical adenopathy.  Skin:    General: Skin is warm.     Findings: No rash.  Neurological:     Mental Status: She is alert and oriented to person, place, and time. Mental status is at baseline.     Motor: No weakness.     Gait: Gait normal.  Psychiatric:        Mood and Affect: Mood normal.        Behavior: Behavior normal.        Thought Content: Thought content normal.        Judgment: Judgment normal.     No results found. No results found. No results found for this or any previous visit (from the past 24 hours).  Assessment/Plan: Jamie Wang is a 48 y.o. female present for OV for   Obstructive sleep apnea syndrome OSA precautions Morbid obesity (HCC) Body mass index is 41.88 kg/m. Her BMI today is above 40.  Encouraged patient to work on getting her BMI below 40 before surgery. Restarting GLP-1 today.  Sample provided.  Zepbound  5 mg weekly injection prescribed [facility] Patient will need to skip a dose of GLP 1 week prior to surgery spacing out for at least 2 weeks before surgery Low glycemic diet strongly encouraged Routine exercise encouraged, but she has been back at the gym last week and started to lose weight. *Patient advised a weight loss below 230 is advised prior to surgery.  At that weight her BMI was 39.48  Preoperative clearance (Primary) Labs collected today: - CBC w/Diff - Comp Met (CMET) - Hemoglobin A1c To the best of my knowledge and per patients reported PMH, there is not a medical contraindication for undergoing elective surgery.   BMI is not ideal for surgery at this time, hopefully she will be able to get her BMI less than 40 in the next 6 weeks. Avoid NSAID prior to procedure, per orthopedic team instruction.  Patient understands the purpose of preoperative visit is to attempt to minimize surgical complications and communicate to surgical team chronic conditions and management. No patient is free of risk when undergoing a procedure. The decision about whether to proceed with the operation belongs to the surgeon and the patient. Patient's chronic conditions have been stable.    Reviewed expectations re: course of current medical issues. Discussed self-management of symptoms. Outlined signs and symptoms indicating need for more acute  intervention. Patient verbalized understanding and all questions were answered. Patient received an After-Visit Summary.    Orders Placed This Encounter  Procedures   CBC w/Diff   Comp Met (CMET)   Hemoglobin A1c   Meds ordered this encounter  Medications   tirzepatide  (ZEPBOUND ) 5  MG/0.5ML injection vial    Sig: Inject 5 mg into the skin once a week.    Dispense:  2 mL    Refill:  0   Referral Orders  No referral(s) requested today     Note is dictated utilizing voice recognition software. Although note has been proof read prior to signing, occasional typographical errors still can be missed. If any questions arise, please do not hesitate to call for verification.   electronically signed by:  Charlies Bellini, DO  [facility] [place]

## 2024-01-16 ENCOUNTER — Ambulatory Visit: Payer: Self-pay | Admitting: Family Medicine

## 2024-01-16 NOTE — Telephone Encounter (Signed)
 Preoperative risk assessment completed. Please fax to orthopedic with office visit note from 01/13/2024

## 2024-02-07 ENCOUNTER — Telehealth: Admitting: Emergency Medicine

## 2024-02-07 ENCOUNTER — Other Ambulatory Visit: Payer: Self-pay | Admitting: Adult Health

## 2024-02-07 DIAGNOSIS — J019 Acute sinusitis, unspecified: Secondary | ICD-10-CM | POA: Diagnosis not present

## 2024-02-07 DIAGNOSIS — B9689 Other specified bacterial agents as the cause of diseases classified elsewhere: Secondary | ICD-10-CM | POA: Diagnosis not present

## 2024-02-07 MED ORDER — AMOXICILLIN-POT CLAVULANATE 875-125 MG PO TABS: 1.0000 | ORAL_TABLET | Freq: Two times a day (BID) | ORAL | 0 refills | Status: DC

## 2024-02-07 MED ORDER — BENZONATATE 100 MG PO CAPS: 100.0000 mg | ORAL_CAPSULE | Freq: Two times a day (BID) | ORAL | 0 refills | Status: DC | PRN

## 2024-02-07 NOTE — Patient Instructions (Signed)
 Suzen Moons, thank you for joining Jon CHRISTELLA Belt, NP for today's virtual visit.  While this provider is not your primary care provider (PCP), if your PCP is located in our provider database this encounter information will be shared with them immediately following your visit.   A [facility] MyChart account gives you access to today's visit and all your visits, tests, and labs performed at [facility]  click here if you don't have a [facility] MyChart account or go to [email or web address]  Consent: (Patient) Elfriede Bonini provided verbal consent for this virtual visit at the beginning of the encounter.  Current Medications:  Current Outpatient Medications:    amoxicillin -clavulanate (AUGMENTIN ) 875-125 MG tablet, Take 1 tablet by mouth 2 (two) times daily., Disp: 14 tablet, Rfl: 0   benzonatate (TESSALON) 100 MG capsule, Take 1 capsule (100 mg total) by mouth 2 (two) times daily as needed for cough., Disp: 20 capsule, Rfl: 0   ASPIRIN LOW DOSE 81 MG chewable tablet, Chew 81 mg by mouth 2 (two) times daily., Disp: , Rfl:    cetirizine (ZYRTEC ALLERGY ) 10 MG tablet, , Disp: , Rfl:    escitalopram  (LEXAPRO ) 20 MG tablet, TAKE 1 TABLET BY MOUTH EVERY DAY, Disp: 90 tablet, Rfl: 3   loratadine (CLARITIN) 10 MG tablet, Take 10 mg by mouth daily., Disp: , Rfl:    MAPAP 500 MG capsule, SMARTSIG:2 Capsule(s) By Mouth Every 6 Hours PRN, Disp: , Rfl:    meloxicam  (MOBIC ) 15 MG tablet, Take 15 mg by mouth daily., Disp: , Rfl:    methocarbamol (ROBAXIN) 750 MG tablet, Take 750 mg by mouth every 8 (eight) hours as needed., Disp: , Rfl:    oxyCODONE  (OXY IR/ROXICODONE ) 5 MG immediate release tablet, Take 5 mg by mouth every 4 (four) hours as needed., Disp: , Rfl:    phentermine (ADIPEX-P) 37.5 MG tablet, Take 37.5 mg by mouth every morning., Disp: , Rfl:    tirzepatide  (ZEPBOUND ) 5 MG/0.5ML injection vial, Inject 5 mg into the skin once a week., Disp: 2 mL, Rfl: 0   valACYclovir   (VALTREX ) 500 MG tablet, TAKE 1 TABLET BY MOUTH EVERY DAY, Disp: 90 tablet, Rfl: 3   Medications ordered in this encounter:  Meds ordered this encounter  Medications   amoxicillin -clavulanate (AUGMENTIN ) 875-125 MG tablet    Sig: Take 1 tablet by mouth 2 (two) times daily.    Dispense:  14 tablet    Refill:  0   benzonatate (TESSALON) 100 MG capsule    Sig: Take 1 capsule (100 mg total) by mouth 2 (two) times daily as needed for cough.    Dispense:  20 capsule    Refill:  0     *If you need refills on other medications prior to your next appointment, please contact your pharmacy*  Follow-Up: Call back or seek an in-person evaluation if the symptoms worsen or if the condition fails to improve as anticipated.  [facility] [phone]  Other Instructions  Start using saline irrigation again, such as with a neti pot, several times a day while you are sick. Many neti pots come with salt packets premeasured to use to make saline. If you use your own salt, make sure it is kosher salt or sea salt (don't use table salt as it has iodine in it and you don't need that in your nose). Use distilled water to make saline. If you mix your own saline using your own salt, the recipe is 1/4 teaspoon  salt in 1 cup warm water. Using saline irrigation can help prevent and treat sinus infections.    If you have been instructed to have an in-person evaluation today at a local Urgent Care facility, please use the link below. It will take you to a list of all of our available [facility], including address, phone number and hours of operation. Please do not delay care.  [facility]  If you or a family member do not have a primary care provider, use the link below to schedule a visit and establish care. When you choose a [facility] primary care physician or advanced practice provider, you gain a long-term partner in health. Find a Primary Care Provider  Learn more  about [facility]'s in-office and virtual care options: [facility] - Get Care Now

## 2024-02-07 NOTE — Progress Notes (Signed)
 Virtual Visit Consent   Jamie Wang, you are scheduled for a virtual visit with a [facility] provider today. Just as with appointments in the office, your consent must be obtained to participate. Your consent will be active for this visit and any virtual visit you may have with one of our providers in the next 365 days. If you have a MyChart account, a copy of this consent can be sent to you electronically.  As this is a virtual visit, video technology does not allow for your provider to perform a traditional examination. This may limit your provider's ability to fully assess your condition. If your provider identifies any concerns that need to be evaluated in person or the need to arrange testing (such as labs, EKG, etc.), we will make arrangements to do so. Although advances in technology are sophisticated, we cannot ensure that it will always work on either your end or our end. If the connection with a video visit is poor, the visit may have to be switched to a telephone visit. With either a video or telephone visit, we are not always able to ensure that we have a secure connection.  By engaging in this virtual visit, you consent to the provision of healthcare and authorize for your insurance to be billed (if applicable) for the services provided during this visit. Depending on your insurance coverage, you may receive a charge related to this service.  I need to obtain your verbal consent now. Are you willing to proceed with your visit today? Jamie Wang has provided verbal consent on 02/07/2024 for a virtual visit (video or telephone). Jon CHRISTELLA Belt, NP  Date: 02/07/2024 12:31 PM   Virtual Visit via Video Note   I, Jon CHRISTELLA Belt, connected with  Jamie Wang  ([id number], 04/08/1976) on 02/07/24 at 12:30 PM EDT by a video-enabled telemedicine application and verified that I am speaking with the correct person using two identifiers.  Location: Patient: Virtual Visit Location  Patient: Other: work Provider: Pharmacist, community: Home Office   I discussed the limitations of evaluation and management by telemedicine and the availability of in person appointments. The patient expressed understanding and agreed to proceed.    History of Present Illness: Jamie Wang is a 48 y.o. who identifies as a female who was assigned female at birth, and is being seen today for sinus infection. Hoarse voice for week. B max sinus pain/pressure. Post nasal drainage. Yellow nasal discharge. No SOB. No fever. Some coughing up yellow drainage - believes is from post nasal drainage.   Taking Sudafed, tylenol . Has not used saline irrigation for this illness.  Took one of husband's old tessalon perles for cough last night and it helped - she requests rx of her own.  HPI: HPI  Problems:  Patient Active Problem List   Diagnosis Date Noted   Obstructive sleep apnea syndrome 01/13/2024   E66.813, E66.01, Z68.41 (BMI) of 40.0 to 44.9 in adult [facility] 06/24/2023   Anxiety and depression 05/18/2022   Vitamin D  deficiency 07/19/2016   Perennial allergic rhinitis 06/05/2014    Allergies:  Allergies  Allergen Reactions   Bactrim [Sulfamethoxazole-Trimethoprim] Swelling   Doxycycline Hives   Medications:  Current Outpatient Medications:    amoxicillin -clavulanate (AUGMENTIN ) 875-125 MG tablet, Take 1 tablet by mouth 2 (two) times daily., Disp: 14 tablet, Rfl: 0   benzonatate (TESSALON) 100 MG capsule, Take 1 capsule (100 mg total) by mouth 2 (two) times daily as needed for cough., Disp: 20 capsule, Rfl:  0   ASPIRIN LOW DOSE 81 MG chewable tablet, Chew 81 mg by mouth 2 (two) times daily., Disp: , Rfl:    cetirizine (ZYRTEC ALLERGY ) 10 MG tablet, , Disp: , Rfl:    escitalopram  (LEXAPRO ) 20 MG tablet, TAKE 1 TABLET BY MOUTH EVERY DAY, Disp: 90 tablet, Rfl: 3   loratadine (CLARITIN) 10 MG tablet, Take 10 mg by mouth daily., Disp: , Rfl:    MAPAP 500 MG capsule, SMARTSIG:2  Capsule(s) By Mouth Every 6 Hours PRN, Disp: , Rfl:    meloxicam  (MOBIC ) 15 MG tablet, Take 15 mg by mouth daily., Disp: , Rfl:    methocarbamol (ROBAXIN) 750 MG tablet, Take 750 mg by mouth every 8 (eight) hours as needed., Disp: , Rfl:    oxyCODONE  (OXY IR/ROXICODONE ) 5 MG immediate release tablet, Take 5 mg by mouth every 4 (four) hours as needed., Disp: , Rfl:    phentermine (ADIPEX-P) 37.5 MG tablet, Take 37.5 mg by mouth every morning., Disp: , Rfl:    tirzepatide  (ZEPBOUND ) 5 MG/0.5ML injection vial, Inject 5 mg into the skin once a week., Disp: 2 mL, Rfl: 0   valACYclovir  (VALTREX ) 500 MG tablet, TAKE 1 TABLET BY MOUTH EVERY DAY, Disp: 90 tablet, Rfl: 3  Observations/Objective: Patient is well-developed, well-nourished in no acute distress.  Resting comfortably  at home.  Head is normocephalic, atraumatic.  No labored breathing.  Speech is clear and coherent with logical content.  Patient is alert and oriented at baseline.    Assessment and Plan: 1. Acute bacterial sinusitis (Primary)  Rec restart saline irrigation.   Follow Up Instructions: I discussed the assessment and treatment plan with the patient. The patient was provided an opportunity to ask questions and all were answered. The patient agreed with the plan and demonstrated an understanding of the instructions.  A copy of instructions were sent to the patient via MyChart unless otherwise noted below.   The patient was advised to call back or seek an in-person evaluation if the symptoms worsen or if the condition fails to improve as anticipated.    Jon CHRISTELLA Belt, NP

## 2024-04-06 ENCOUNTER — Telehealth: Payer: Self-pay

## 2024-04-06 NOTE — Telephone Encounter (Signed)
 Copied from CRM #[id number]. Topic: Clinical - Medication Question >> Apr 06, 2024  1:48 PM Jamie Wang wrote: Reason for CRM: Patient stated that she never did the prescription of tirzepatide  (ZEPBOUND ) 5 MG/0.5ML injection vial and is wanting a new prescription for it on [employer].

## 2024-04-10 NOTE — Telephone Encounter (Signed)
 Patient is due for follow-up in order to receive any further refills of her Zepbound . The prescription she is inquiring about was written 3 months ago.

## 2024-04-17 ENCOUNTER — Ambulatory Visit: Admitting: Family Medicine

## 2024-04-17 ENCOUNTER — Encounter: Payer: Self-pay | Admitting: Family Medicine

## 2024-04-17 VITALS — BP 126/76 | HR 89 | Temp 98.3°F | Wt 243.0 lb

## 2024-04-17 DIAGNOSIS — G4733 Obstructive sleep apnea (adult) (pediatric): Secondary | ICD-10-CM | POA: Diagnosis not present

## 2024-04-17 DIAGNOSIS — Z713 Dietary counseling and surveillance: Secondary | ICD-10-CM | POA: Diagnosis not present

## 2024-04-17 DIAGNOSIS — E66813 Obesity, class 3: Secondary | ICD-10-CM

## 2024-04-17 DIAGNOSIS — Z6841 Body Mass Index (BMI) 40.0 and over, adult: Secondary | ICD-10-CM

## 2024-04-17 MED ORDER — ZEPBOUND 2.5 MG/0.5ML ~~LOC~~ SOLN: 2.5000 mg | SUBCUTANEOUS | 0 refills | Status: AC

## 2024-04-17 MED ORDER — FLUOCINONIDE 0.05 % EX CREA: 1.0000 | TOPICAL_CREAM | Freq: Two times a day (BID) | CUTANEOUS | 1 refills | Status: AC

## 2024-04-17 MED ORDER — ZEPBOUND 5 MG/0.5ML ~~LOC~~ SOLN: 5.0000 mg | SUBCUTANEOUS | 0 refills | Status: AC

## 2024-04-17 NOTE — Patient Instructions (Addendum)
 Return in about 6 weeks (around 05/29/2024) for weight loss.        Great to see you today.  I have refilled the medication(s) we provide.   If labs were collected or images ordered, we will inform you of  results once we have received them and reviewed. We will contact you either by echart message, or telephone call.  Please give ample time to the testing facility, and our office to run,  receive and review results. Please do not call inquiring of results, even if you can see them in your chart. We will contact you as soon as we are able. If it has been over 1 week since the test was completed, and you have not yet heard from us , then please call us .    - echart message- for normal results that have been seen by the patient already.   - telephone call: abnormal results or if patient has not viewed results in their echart.  If a referral to a specialist was entered for you, please call us  in 2 weeks if you have not heard from the specialist office to schedule.

## 2024-04-17 NOTE — Progress Notes (Signed)
 "      Jamie Wang , 05-13-1975, 48 y.o., female MRN: [id number] Patient Care Team    Relationship Specialty Notifications Start End  Catherine Charlies LABOR, DO PCP - General Family Medicine  06/24/23   [facility]  Optometry  03/20/23   Signa Delon LABOR, NP Nurse Practitioner Obstetrics and Gynecology  06/24/23   Beverley Evalene BIRCH, MD Attending Physician Orthopedic Surgery  06/24/23     Chief Complaint  Patient presents with   Weight loss counseling    Obesity   Rash    3 weeks; R arm. Mild itching.      Subjective: Jamie Wang is a 48 y.o. Pt presents for an OV to discuss weight loss counseling and new acute concern for rash on her arm..  Pt has tried hydrocortisone to ease their symptoms of rash.  Patient reports rash is itchy  In September patient was provided with a sample of Ozempic 0.25 mg weekly injection x 4 and a prescription for Zepbound  5 mg weekly injection was called into the pharmacy.  Patient reports she never filled the Zepbound  5 mg weekly injection.  She reports the Ozempic 0.25 mg injection was well-tolerated without any side effects.   Patient denies any history of medullary thyroid  cancer in herself or her family, no family history of neuroendocrine tumors. BMI: 41.88> 41.71 Lbs: 244> 243    06/24/2023    1:17 PM 05/18/2022    2:33 PM 12/30/2020    3:23 PM 06/29/2016    3:53 PM  Depression screen PHQ 2/9  Decreased Interest 0 2 0 0  Down, Depressed, Hopeless 0 2 0 0  PHQ - 2 Score 0 4 0 0  Altered sleeping 1 1 0   Tired, decreased energy 1 1 0   Change in appetite 3 3 0   Feeling bad or failure about yourself  0 0 0   Trouble concentrating 0 0 0   Moving slowly or fidgety/restless 0 0 0   Suicidal thoughts 0 0 0   PHQ-9 Score 5  9  0    Difficult doing work/chores Not difficult at all        Data saved with a previous flowsheet row definition    Allergies[1] Social History   Social History Narrative   Marital status/children/pets:married, 2  children   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, Engineer, Agricultural:      -smoke alarm in the home:Yes     - wears seatbelt: Yes     - Feels safe in their relationships: Yes      Past Medical History:  Diagnosis Date   AMA (advanced maternal age) multigravida 35+ 06/04/2014   Anxiety    Arthritis    both knees   Asthma    Dysmenorrhea 06/29/2016   Fetal cardiac echogenic focus, antepartum 09/17/2014   Small isolated Lt EICF, all other cardiac structures normal     Foot sprain    mid foot sprain   GERD (gastroesophageal reflux disease)    takes zantac    Menorrhagia with irregular cycle 06/29/2016   Multiple allergies    Obesity    Plantar fasciitis, bilateral    S/P repeat low transverse C-section 12/25/2014   Sleep apnea    UTI (urinary tract infection)    Vitamin D  deficiency 07/19/2016   Past Surgical History:  Procedure Laterality Date   carpel tunnel release  04/20/2003   bilateral   CESAREAN SECTION  04/13/2012   Procedure: CESAREAN SECTION;  Surgeon: Norleen LULLA Server, MD;  Location: [facility];  Service: Obstetrics;  Laterality: N/A;   CESAREAN SECTION WITH BILATERAL TUBAL LIGATION Bilateral 12/24/2014   Procedure: CESAREAN SECTION WITH BILATERAL TUBAL LIGATION, wide excision of cicatrix;  Surgeon: Norleen Server LULLA, MD;  Location: [facility];  Service: Obstetrics;  Laterality: Bilateral;  Combined Spinal and Epidural   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   WISDOM TOOTH EXTRACTION     Family History  Problem Relation Age of Onset   Hypertension Mother    Hyperlipidemia Mother    Asthma Mother    Prostate cancer Father    Heart disease Father    Arthritis Father    Hyperlipidemia Father    Hypertension Father    Learning disabilities Brother    Stroke Maternal Grandmother    Arthritis Maternal Grandmother    Congestive Heart Failure Maternal Grandfather    Hypertension Paternal Grandmother    Other Paternal Grandmother        eye issues   Heart attack  Paternal Grandfather    Early death Paternal Grandfather    Allergies as of 04/17/2024       Reactions   Bactrim [sulfamethoxazole-trimethoprim] Swelling   Doxycycline Hives        Medication List        Accurate as of April 17, 2024  4:19 PM. If you have any questions, ask your nurse or doctor.          STOP taking these medications    amoxicillin -clavulanate 875-125 MG tablet Commonly known as: AUGMENTIN  Stopped by: Charlies Bellini, DO   Aspirin Low Dose 81 MG chewable tablet Generic drug: aspirin Stopped by: Charlies Bellini, DO   benzonatate  100 MG capsule Commonly known as: TESSALON  Stopped by: Charlies Bellini, DO   Claritin 10 MG tablet Generic drug: loratadine Stopped by: Graden Hoshino, DO   Mapap 500 MG capsule Generic drug: Acetaminophen  Stopped by: Charlies Bellini, DO   methocarbamol 750 MG tablet Commonly known as: ROBAXIN Stopped by: Myrella Fahs, DO   oxyCODONE  5 MG immediate release tablet Commonly known as: Oxy IR/ROXICODONE  Stopped by: Charlies Bellini, DO   phentermine 37.5 MG tablet Commonly known as: ADIPEX-P Stopped by: Charlies Bellini, DO       TAKE these medications    escitalopram  20 MG tablet Commonly known as: LEXAPRO  TAKE 1 TABLET BY MOUTH EVERY DAY   fluocinonide cream 0.05 % Commonly known as: LIDEX Apply 1 Application topically 2 (two) times daily. Started by: Charlies Bellini, DO   meloxicam  15 MG tablet Commonly known as: MOBIC  Take 15 mg by mouth daily.   valACYclovir  500 MG tablet Commonly known as: VALTREX  TAKE 1 TABLET BY MOUTH EVERY DAY   Zepbound  2.5 MG/0.5ML injection vial Generic drug: tirzepatide  Inject 2.5 mg into the skin once a week. What changed: You were already taking a medication with the same name, and this prescription was added. Make sure you understand how and when to take each. Changed by: Charlies Bellini, DO   Zepbound  5 MG/0.5ML injection vial Generic drug: tirzepatide  Inject 5 mg into the skin once a  week. Start taking on: May 08, 2024 What changed: These instructions start on May 08, 2024. If you are unsure what to do until then, ask your doctor or other care provider. Changed by: Charlies Bellini, DO   ZyrTEC Allergy  10 MG tablet Generic drug: cetirizine        All past medical history, surgical history, allergies, family history, immunizations andmedications were updated  in the EMR today and reviewed under the history and medication portions of their EMR.     Review of Systems  Constitutional: Negative.   HENT: Negative.    Eyes: Negative.   Respiratory: Negative.    Cardiovascular: Negative.   Gastrointestinal: Negative.   Genitourinary: Negative.   Musculoskeletal: Negative.   Skin: Negative.   Neurological: Negative.   Endo/Heme/Allergies: Negative.   Psychiatric/Behavioral: Negative.    All other systems reviewed and are negative.  Negative, with the exception of above mentioned in HPI   Objective:  BP 126/76   Pulse 89   Temp 98.3 F (36.8 C)   Wt 243 lb (110.2 kg)   SpO2 97%   BMI 41.71 kg/m  Body mass index is 41.71 kg/m.  Physical Exam Vitals and nursing note reviewed.  Constitutional:      General: She is not in acute distress.    Appearance: Normal appearance. She is normal weight. She is not ill-appearing or toxic-appearing.  HENT:     Head: Normocephalic and atraumatic.  Eyes:     General: No scleral icterus.       Right eye: No discharge.        Left eye: No discharge.     Extraocular Movements: Extraocular movements intact.     Conjunctiva/sclera: Conjunctivae normal.     Pupils: Pupils are equal, round, and reactive to light.  Cardiovascular:     Rate and Rhythm: Normal rate.  Skin:    Findings: Rash (Very fine raised rash forearm.  No erythema.  Mild scaling is present.) present.  Neurological:     Mental Status: She is alert and oriented to person, place, and time. Mental status is at baseline.     Motor: No weakness.      Coordination: Coordination normal.     Gait: Gait normal.  Psychiatric:        Mood and Affect: Mood normal.        Behavior: Behavior normal.        Thought Content: Thought content normal.        Judgment: Judgment normal.     No results found. No results found. No results found for this or any previous visit (from the past 24 hours).  Assessment/Plan: Jamie Wang is a 48 y.o. female present for OV for  Morbid obesity (HCC)/weight loss counseling - Patient was counseled on exercise, calorie counting, weight loss and potential medications to help with weight loss today. -Patient was provided with online resources for: Weekly net calorie calculator.  Applications for calorie counting.  Patient was advised to ensure she is taking in adequate nutrition daily by meeting calorie goals. -Patient was educated on dietary changes to not only lose weight but to eat healthy.  Patient was educated on glycemic index. -Patient was educated on exercise goal of 150 minutes a week (plus warm up and cool down) of cardiovascular exercise.  Patient was educated on heart rate for cardiovascular and fat burning zones. -Patient was encouraged to maintain adequate water consumption of at least 120 ounces a day, more if exercising/sweating. Start Zepbound  2.5 mg weekly injection prescribed [facility], after 4 injections increase to Zepbound  5 mg weekly injection prescribed to [facility] direct pharmacy Low glycemic diet strongly encouraged Routine exercise Patient was instructed on insulin needle purchase.   Rash Very fine rash of her forearm.  Start with Lidex twice daily until resolved.  Reviewed expectations re: course of current medical issues. Discussed self-management of symptoms. Outlined  signs and symptoms indicating need for more acute intervention. Patient verbalized understanding and all questions were answered. Patient received an After-Visit Summary.  Return in about 6 weeks  (around 05/29/2024) for weight loss.   No orders of the defined types were placed in this encounter.  Meds ordered this encounter  Medications   tirzepatide  (ZEPBOUND ) 2.5 MG/0.5ML injection vial    Sig: Inject 2.5 mg into the skin once a week.    Dispense:  2 mL    Refill:  0    Please fill first.   tirzepatide  (ZEPBOUND ) 5 MG/0.5ML injection vial    Sig: Inject 5 mg into the skin once a week.    Dispense:  2 mL    Refill:  0    2nd script of 2   fluocinonide cream (LIDEX) 0.05 %    Sig: Apply 1 Application topically 2 (two) times daily.    Dispense:  30 g    Refill:  1   Referral Orders  No referral(s) requested today     Note is dictated utilizing voice recognition software. Although note has been proof read prior to signing, occasional typographical errors still can be missed. If any questions arise, please do not hesitate to call for verification.   electronically signed by:  Charlies Bellini, DO  [facility]       [1]  Allergies Allergen Reactions   Bactrim [Sulfamethoxazole-Trimethoprim] Swelling   Doxycycline Hives   "

## 2024-05-11 ENCOUNTER — Ambulatory Visit: Admitting: Family Medicine

## 2024-05-28 ENCOUNTER — Ambulatory Visit: Admitting: Family Medicine

## 2024-06-25 ENCOUNTER — Encounter: Admitting: Family Medicine
